# Patient Record
Sex: Female | Born: 1955 | Race: White | Hispanic: No | Marital: Married | Smoking: Never smoker
Health system: Southern US, Community
[De-identification: ages and names within clinical notes are randomized; demographics above are authoritative.]

## PROBLEM LIST (undated history)

## (undated) DIAGNOSIS — I10 Essential (primary) hypertension: Secondary | ICD-10-CM

## (undated) DIAGNOSIS — K219 Gastro-esophageal reflux disease without esophagitis: Secondary | ICD-10-CM

## (undated) DIAGNOSIS — E785 Hyperlipidemia, unspecified: Secondary | ICD-10-CM

## (undated) DIAGNOSIS — E119 Type 2 diabetes mellitus without complications: Secondary | ICD-10-CM

## (undated) DIAGNOSIS — T7840XA Allergy, unspecified, initial encounter: Secondary | ICD-10-CM

## (undated) DIAGNOSIS — F419 Anxiety disorder, unspecified: Secondary | ICD-10-CM

## (undated) HISTORY — DX: Allergy, unspecified, initial encounter: T78.40XA

## (undated) HISTORY — DX: Type 2 diabetes mellitus without complications: E11.9

## (undated) HISTORY — DX: Gastro-esophageal reflux disease without esophagitis: K21.9

## (undated) HISTORY — DX: Essential (primary) hypertension: I10

## (undated) HISTORY — PX: OTHER SURGICAL HISTORY: SHX169

## (undated) HISTORY — DX: Hyperlipidemia, unspecified: E78.5

## (undated) HISTORY — DX: Anxiety disorder, unspecified: F41.9

---

## 1988-07-03 HISTORY — PX: BREAST EXCISIONAL BIOPSY: SUR124

## 2000-02-24 ENCOUNTER — Other Ambulatory Visit: Admission: RE | Admit: 2000-02-24 | Discharge: 2000-02-24 | Payer: Self-pay | Admitting: Obstetrics & Gynecology

## 2001-07-03 DIAGNOSIS — C801 Malignant (primary) neoplasm, unspecified: Secondary | ICD-10-CM

## 2001-07-03 HISTORY — DX: Malignant (primary) neoplasm, unspecified: C80.1

## 2001-08-01 ENCOUNTER — Other Ambulatory Visit: Admission: RE | Admit: 2001-08-01 | Discharge: 2001-08-01 | Payer: Self-pay | Admitting: Obstetrics & Gynecology

## 2002-07-22 ENCOUNTER — Other Ambulatory Visit: Admission: RE | Admit: 2002-07-22 | Discharge: 2002-07-22 | Payer: Self-pay | Admitting: Obstetrics & Gynecology

## 2005-03-09 ENCOUNTER — Other Ambulatory Visit: Admission: RE | Admit: 2005-03-09 | Discharge: 2005-03-09 | Payer: Self-pay | Admitting: Obstetrics & Gynecology

## 2005-03-23 ENCOUNTER — Encounter: Admission: RE | Admit: 2005-03-23 | Discharge: 2005-03-23 | Payer: Self-pay | Admitting: Obstetrics & Gynecology

## 2006-02-27 ENCOUNTER — Encounter: Admission: RE | Admit: 2006-02-27 | Discharge: 2006-02-27 | Payer: Self-pay | Admitting: Obstetrics & Gynecology

## 2007-04-01 ENCOUNTER — Encounter: Admission: RE | Admit: 2007-04-01 | Discharge: 2007-04-01 | Payer: Self-pay | Admitting: Obstetrics & Gynecology

## 2008-04-15 ENCOUNTER — Encounter: Admission: RE | Admit: 2008-04-15 | Discharge: 2008-04-15 | Payer: Self-pay | Admitting: Obstetrics & Gynecology

## 2009-04-16 ENCOUNTER — Encounter: Admission: RE | Admit: 2009-04-16 | Discharge: 2009-04-16 | Payer: Self-pay | Admitting: Obstetrics & Gynecology

## 2009-04-26 ENCOUNTER — Encounter: Admission: RE | Admit: 2009-04-26 | Discharge: 2009-04-26 | Payer: Self-pay | Admitting: Obstetrics & Gynecology

## 2009-10-11 ENCOUNTER — Encounter: Admission: RE | Admit: 2009-10-11 | Discharge: 2009-10-11 | Payer: Self-pay | Admitting: Obstetrics & Gynecology

## 2010-04-19 ENCOUNTER — Encounter: Admission: RE | Admit: 2010-04-19 | Discharge: 2010-04-19 | Payer: Self-pay | Admitting: Obstetrics and Gynecology

## 2010-07-24 ENCOUNTER — Encounter: Payer: Self-pay | Admitting: Obstetrics & Gynecology

## 2011-03-13 ENCOUNTER — Other Ambulatory Visit: Payer: Self-pay | Admitting: Obstetrics and Gynecology

## 2011-03-13 DIAGNOSIS — Z1231 Encounter for screening mammogram for malignant neoplasm of breast: Secondary | ICD-10-CM

## 2011-04-21 ENCOUNTER — Ambulatory Visit
Admission: RE | Admit: 2011-04-21 | Discharge: 2011-04-21 | Disposition: A | Discharge status: Home / Self Care | Payer: BC Managed Care – PPO | Source: Ambulatory Visit | Attending: Obstetrics and Gynecology | Admitting: Obstetrics and Gynecology

## 2011-04-21 DIAGNOSIS — Z1231 Encounter for screening mammogram for malignant neoplasm of breast: Secondary | ICD-10-CM

## 2012-03-19 ENCOUNTER — Other Ambulatory Visit: Payer: Self-pay | Admitting: Obstetrics and Gynecology

## 2012-03-19 DIAGNOSIS — Z1231 Encounter for screening mammogram for malignant neoplasm of breast: Secondary | ICD-10-CM

## 2012-04-22 ENCOUNTER — Ambulatory Visit
Admission: RE | Admit: 2012-04-22 | Discharge: 2012-04-22 | Disposition: A | Discharge status: Home / Self Care | Payer: BC Managed Care – PPO | Source: Ambulatory Visit | Attending: Obstetrics and Gynecology | Admitting: Obstetrics and Gynecology

## 2012-04-22 DIAGNOSIS — Z1231 Encounter for screening mammogram for malignant neoplasm of breast: Secondary | ICD-10-CM

## 2013-03-18 ENCOUNTER — Other Ambulatory Visit: Payer: Self-pay

## 2013-03-18 DIAGNOSIS — Z1231 Encounter for screening mammogram for malignant neoplasm of breast: Secondary | ICD-10-CM

## 2013-04-23 ENCOUNTER — Ambulatory Visit
Admission: RE | Admit: 2013-04-23 | Discharge: 2013-04-23 | Disposition: A | Discharge status: Home / Self Care | Payer: BC Managed Care – PPO | Source: Ambulatory Visit

## 2013-04-23 DIAGNOSIS — Z1231 Encounter for screening mammogram for malignant neoplasm of breast: Secondary | ICD-10-CM

## 2014-03-23 ENCOUNTER — Other Ambulatory Visit: Payer: Self-pay

## 2014-03-23 DIAGNOSIS — Z1231 Encounter for screening mammogram for malignant neoplasm of breast: Secondary | ICD-10-CM

## 2014-04-27 ENCOUNTER — Ambulatory Visit
Admission: RE | Admit: 2014-04-27 | Discharge: 2014-04-27 | Disposition: A | Discharge status: Home / Self Care | Payer: BC Managed Care – PPO | Source: Ambulatory Visit

## 2014-04-27 DIAGNOSIS — Z1231 Encounter for screening mammogram for malignant neoplasm of breast: Secondary | ICD-10-CM

## 2015-03-26 ENCOUNTER — Other Ambulatory Visit: Payer: Self-pay

## 2015-03-26 DIAGNOSIS — Z1231 Encounter for screening mammogram for malignant neoplasm of breast: Secondary | ICD-10-CM

## 2015-04-29 ENCOUNTER — Ambulatory Visit: Payer: Self-pay

## 2015-05-19 ENCOUNTER — Ambulatory Visit
Admission: RE | Admit: 2015-05-19 | Discharge: 2015-05-19 | Disposition: A | Discharge status: Home / Self Care | Payer: 59 | Source: Ambulatory Visit

## 2015-05-19 DIAGNOSIS — Z1231 Encounter for screening mammogram for malignant neoplasm of breast: Secondary | ICD-10-CM

## 2016-04-18 ENCOUNTER — Other Ambulatory Visit: Payer: Self-pay | Admitting: Internal Medicine

## 2016-04-18 DIAGNOSIS — Z1231 Encounter for screening mammogram for malignant neoplasm of breast: Secondary | ICD-10-CM

## 2016-05-23 ENCOUNTER — Ambulatory Visit
Admission: RE | Admit: 2016-05-23 | Discharge: 2016-05-23 | Disposition: A | Discharge status: Home / Self Care | Payer: BLUE CROSS/BLUE SHIELD | Source: Ambulatory Visit | Attending: Internal Medicine | Admitting: Internal Medicine

## 2016-05-23 DIAGNOSIS — Z1231 Encounter for screening mammogram for malignant neoplasm of breast: Secondary | ICD-10-CM

## 2016-07-03 HISTORY — PX: COLONOSCOPY: SHX174

## 2017-04-26 ENCOUNTER — Other Ambulatory Visit: Payer: Self-pay | Admitting: Internal Medicine

## 2017-04-26 DIAGNOSIS — Z1231 Encounter for screening mammogram for malignant neoplasm of breast: Secondary | ICD-10-CM

## 2017-05-29 ENCOUNTER — Ambulatory Visit: Payer: BLUE CROSS/BLUE SHIELD

## 2017-06-11 ENCOUNTER — Ambulatory Visit: Payer: BLUE CROSS/BLUE SHIELD

## 2017-07-03 DIAGNOSIS — I495 Sick sinus syndrome: Secondary | ICD-10-CM

## 2017-07-03 HISTORY — DX: Sick sinus syndrome: I49.5

## 2017-07-10 ENCOUNTER — Ambulatory Visit
Admission: RE | Admit: 2017-07-10 | Discharge: 2017-07-10 | Disposition: A | Discharge status: Home / Self Care | Payer: BLUE CROSS/BLUE SHIELD | Source: Ambulatory Visit | Attending: Internal Medicine | Admitting: Internal Medicine

## 2017-07-10 DIAGNOSIS — Z1231 Encounter for screening mammogram for malignant neoplasm of breast: Secondary | ICD-10-CM

## 2017-10-31 HISTORY — PX: PACEMAKER IMPLANT: EP1218

## 2018-06-03 ENCOUNTER — Other Ambulatory Visit: Payer: Self-pay | Admitting: Internal Medicine

## 2018-06-03 DIAGNOSIS — Z1231 Encounter for screening mammogram for malignant neoplasm of breast: Secondary | ICD-10-CM

## 2018-07-02 ENCOUNTER — Telehealth: Payer: Self-pay | Admitting: Gastroenterology

## 2018-07-02 NOTE — Telephone Encounter (Signed)
Please see previous message and advise with repeat colonoscopy date;

## 2018-07-02 NOTE — Telephone Encounter (Signed)
Pt called inquiring about her colon recall date, she stated that her last colonoscopy was with Dr. Lyndel Safe in [place]. I was not able to find last colon report. She wants to know when she is due. Pls call her.

## 2018-07-04 NOTE — Telephone Encounter (Signed)
Pt called back to ask about recall colonoscopy date.

## 2018-07-09 NOTE — Telephone Encounter (Signed)
This report is on your desk.

## 2018-07-09 NOTE — Telephone Encounter (Signed)
Had colonoscopy 12/2016-5 colonic polyps status post polypectomy, biopsies tubular adenomas.  Plan; recall colonoscopy in 3YEARS. 12/2019. Pl inform the pt

## 2018-07-09 NOTE — Telephone Encounter (Signed)
Meghan Nguyen Can you find out when is the recall from our old system and infrom. Also, add to the new recall log. If any problems, pl print out the last colon report/biopsies and let me know. Thanks RG

## 2018-07-09 NOTE — Telephone Encounter (Signed)
Patient has been notified, recall put in [place].

## 2018-07-12 ENCOUNTER — Inpatient Hospital Stay: Admission: RE | Admit: 2018-07-12 | Payer: BLUE CROSS/BLUE SHIELD | Source: Ambulatory Visit

## 2019-06-23 ENCOUNTER — Telehealth: Payer: Self-pay

## 2019-06-23 NOTE — Telephone Encounter (Signed)
NOTES ON FILE FROM [facility] SENT REFERRAL TO [facility]

## 2019-06-30 ENCOUNTER — Telehealth: Payer: Self-pay

## 2019-06-30 NOTE — Telephone Encounter (Signed)
Medical records received from [facility]. Records routed to chart prep. 06/30/19 vlm

## 2019-07-01 ENCOUNTER — Telehealth: Payer: Self-pay

## 2019-07-01 NOTE — Telephone Encounter (Signed)
SENT RECORDS BY MAIL ON 07/01/19.TOO MANY PAGES TO FAX(122 PAGES)

## 2019-07-18 ENCOUNTER — Other Ambulatory Visit: Payer: Self-pay | Admitting: Internal Medicine

## 2019-07-18 DIAGNOSIS — Z780 Asymptomatic menopausal state: Secondary | ICD-10-CM

## 2019-07-18 DIAGNOSIS — Z1231 Encounter for screening mammogram for malignant neoplasm of breast: Secondary | ICD-10-CM

## 2019-08-12 ENCOUNTER — Ambulatory Visit: Payer: 59 | Admitting: Cardiovascular Disease

## 2019-08-12 ENCOUNTER — Encounter: Payer: Self-pay | Admitting: Cardiovascular Disease

## 2019-08-12 ENCOUNTER — Other Ambulatory Visit: Payer: Self-pay

## 2019-08-12 VITALS — BP 112/72 | HR 68 | Ht 62.0 in | Wt 169.0 lb

## 2019-08-12 DIAGNOSIS — E669 Obesity, unspecified: Secondary | ICD-10-CM

## 2019-08-12 DIAGNOSIS — Z950 Presence of cardiac pacemaker: Secondary | ICD-10-CM | POA: Diagnosis not present

## 2019-08-12 DIAGNOSIS — I10 Essential (primary) hypertension: Secondary | ICD-10-CM

## 2019-08-12 DIAGNOSIS — I495 Sick sinus syndrome: Secondary | ICD-10-CM | POA: Diagnosis not present

## 2019-08-12 DIAGNOSIS — E7800 Pure hypercholesterolemia, unspecified: Secondary | ICD-10-CM | POA: Diagnosis not present

## 2019-08-12 DIAGNOSIS — E1169 Type 2 diabetes mellitus with other specified complication: Secondary | ICD-10-CM | POA: Diagnosis not present

## 2019-08-12 NOTE — Patient Instructions (Addendum)

## 2019-08-12 NOTE — Progress Notes (Signed)
Cardiology Office Note:    Date:  08/14/2019   ID:  Meghan Nguyen, DOB Jan 03, 1956, MRN [id number]  PCP:  Meghan Johns, MD  Cardiologist:  No primary care provider on file.  Electrophysiologist:  None previously Dr. Minna Nguyen  Previous cardiologist: Meghan Nguyen., MD , [facility]  Chief Complaint  Patient presents with  . Pacemaker Check  Meghan Nguyen is to establish new cardiology care in our practice due to insurance purposes.  History of Present Illness:    Meghan Nguyen is a 64 y.o. female with a hx of severe sinus node dysfunction leading to syncope, sinus pauses of greater than 5 seconds documented on event monitor in 2019.  She received a dual-chamber Biotronik pacemaker in May 2019 and has not had problems with dizziness, syncope or fatigue since then.  Additional medical problems include hypertension and hypercholesterolemia and type II diabetes mellitus.  Cardiac catheterization in 2019 did not show any evidence of coronary artery disease (false positive nuclear stress test) and LV angiography documented normal left ventricular ejection fraction.  She is not sure if she ever had an echocardiogram and I was unable to locate any records of an echo through [facility].  Her last pacemaker download was in October 2020 and showed normal findings.  I performed a comprehensive pacemaker check in the office today including lead capture threshold testing.  The device is functioning normally.  The estimated generator longevity is 8 years.  Lead parameters are excellent.  She does not require ventricular pacing and has roughly 52% atrial pacing with appropriate heart rate histograms (CLS on).  There have been no episodes of atrial fibrillation or atrial mode switch, but interestingly she did have a 20 beat episode of nonsustained ventricular tachycardia on May 25, 2019, after her last pacemaker download.  Her ECG shows atrial paced, ventricular sensed rhythm with prolonged AV delay at  230 ms, but is otherwise completely normal.  The QTC is 448 ms.  The patient specifically denies any chest pain at rest exertion, dyspnea at rest or with exertion, orthopnea, paroxysmal nocturnal dyspnea, syncope, palpitations, focal neurological deficits, intermittent claudication, lower extremity edema, unexplained weight gain, cough, hemoptysis or wheezing.  Glycemic control is excellent with a recent hemoglobin A1c of 5.8%.  Likewise her lipid profile is good with total cholesterol 137, LDL 50 HDL 45, triglycerides 167.  She has normal renal function.  Past Medical History:  Diagnosis Date  . SSS (sick sinus syndrome) ([place]) 2019    Past Surgical History:  Procedure Laterality Date  . BREAST EXCISIONAL BIOPSY Left 1990   benign  . PACEMAKER IMPLANT Left 10/2017   Biotronik    Current Medications: Current Meds  Medication Sig  . amLODipine (NORVASC) 5 MG tablet Take 5 mg by mouth daily.  Marland Kitchen atorvastatin (LIPITOR) 40 MG tablet Take 40 mg by mouth daily.  Marland Kitchen Black Elderberry (SAMBUCUS ELDERBERRY PO) Take by mouth daily.  . Cholecalciferol (VITAMIN D3) 50 MCG (2000 UT) TABS Take 2 tablets by mouth daily.  . citalopram (CELEXA) 20 MG tablet Take 20 mg by mouth daily.  Marland Kitchen loratadine (CLARITIN) 10 MG tablet Take 10 mg by mouth daily.  . Melatonin 10 MG TABS Take by mouth at bedtime.  . metoprolol tartrate (LOPRESSOR) 25 MG tablet Take 25 mg by mouth 2 (two) times daily.  Marland Kitchen MYRBETRIQ 25 MG TB24 tablet Take 25 mg by mouth daily.  . nitroGLYCERIN (NITROSTAT) 0.4 MG SL tablet Place under the tongue.  . Omega-3 1000  MG CAPS Take by mouth daily.  Marland Kitchen omeprazole (PRILOSEC) 40 MG capsule Take 40 mg by mouth daily.  . TRULICITY A999333 0000000 SOPN SMARTSIG:1 Pre-Filled Pen Syringe SUB-Q Once a Week  . Turmeric 500 MG CAPS Take by mouth daily.     Allergies:   Patient has no known allergies.   Social History   Socioeconomic History  . Marital status: Married    Spouse name: Not on file  .  Number of children: Not on file  . Years of education: Not on file  . Highest education level: Not on file  Occupational History  . Not on file  Tobacco Use  . Smoking status: Never Smoker  . Smokeless tobacco: Never Used  Substance and Sexual Activity  . Alcohol use: Not on file  . Drug use: Not on file  . Sexual activity: Not on file  Other Topics Concern  . Not on file  Social History Narrative  . Not on file   Social Determinants of Health   Financial Resource Strain:   . Difficulty of Paying Living Expenses: Not on file  Food Insecurity:   . Worried About Charity fundraiser in the Last Year: Not on file  . Ran Out of Food in the Last Year: Not on file  Transportation Needs:   . Lack of Transportation (Medical): Not on file  . Lack of Transportation (Non-Medical): Not on file  Physical Activity:   . Days of Exercise per Week: Not on file  . Minutes of Exercise per Session: Not on file  Stress:   . Feeling of Stress : Not on file  Social Connections:   . Frequency of Communication with Friends and Family: Not on file  . Frequency of Social Gatherings with Friends and Family: Not on file  . Attends Religious Services: Not on file  . Active Member of Clubs or Organizations: Not on file  . Attends Archivist Meetings: Not on file  . Marital Status: Not on file     Family History: The patient's family history significantly negative for early onset coronary or peripheral vascular disease or rhythm abnormalities. ROS:   Please see the history of present illness.     All other systems reviewed and are negative.  EKGs/Labs/Other Studies Reviewed:    The following studies were reviewed today: Extensive notes from [facility] including several pacemaker downloads, cardiac catheterization and nuclear stress test findings and labs.  EKG:  EKG is  ordered today.  The ekg ordered today demonstrates atrial paced, ventricular sensed rhythm with long AV  conduction time at 230 ms, otherwise normal tracing.  QTc 448 ms  Recent Labs: No results found for requested labs within last 8760 hours.  04/15/2019 creatinine 0.71, potassium 4.2, normal liver function tests 07/15/2019 Creatinine 0.77, potassium 4.3, normal liver function tests, TSH 1.15, hemoglobin 13.8, hemoglobin A1c 5.8%. Recent Lipid Panel No results found for: CHOL, TRIG, HDL, CHOLHDL, VLDL, LDLCALC, LDLDIRECT  04/15/2019 Total cholesterol 121, triglycerides are 123, HDL 36, LDL 50 07/15/2019 Total cholesterol 137, HDL 45, triglyceride 167, LDL 50  Physical Exam:    VS:  BP 112/72   Pulse 68   Ht 5\' 2"  (1.575 m)   Wt 169 lb (76.7 kg)   BMI 30.91 kg/m     Wt Readings from Last 3 Encounters:  08/12/19 169 lb (76.7 kg)     GEN: Borderline obese, well nourished, well developed in no acute distress HEENT: Normal NECK: No JVD;  No carotid bruits LYMPHATICS: No lymphadenopathy CARDIAC: Healthy left subclavian pacemaker site, RRR, no murmurs, rubs, gallops RESPIRATORY:  Clear to auscultation without rales, wheezing or rhonchi  ABDOMEN: Soft, non-tender, non-distended MUSCULOSKELETAL:  No edema; No deformity  SKIN: Warm and dry NEUROLOGIC:  Alert and oriented x 3 PSYCHIATRIC:  Normal affect   ASSESSMENT:    1. SSS (sick sinus syndrome) ([place])   2. Pacemaker   3. Hypercholesterolemia   4. Diabetes mellitus type 2 in obese ([place])   5. Essential hypertension    PLAN:    In order of problems listed above:  1. SSS: History of syncope and severe sinus bradycardia as well as impressively long sinus pauses over 5 seconds.  Asymptomatic since pacemaker implantation.  Excellent heart rate histogram distribution with current device sensor settings. 2. Pacemaker: Normal device function.  Will enroll in our device clinic and perform routine downloads remotely every 3 months, yearly office visit. 3. HLP: Excellent lipid profile on current medical regimen with atorvastatin. 4. DM:  Outstanding glycemic control on Trulicity 5. HTN: Well-controlled may benefit from ARB rather than calcium channel blocker for renal protection, but will defer this to her primary care provider/endocrinologist.  She has excellent renal function at this time.   Medication Adjustments/Labs and Tests Ordered: Current medicines are reviewed at length with the patient today.  Concerns regarding medicines are outlined above.  Orders Placed This Encounter  Procedures  . EKG 12-Lead   No orders of the defined types were placed in this encounter.   Patient Instructions  Medication Instructions:  No changes *If you need a refill on your cardiac medications before your next appointment, please call your pharmacy*  Lab Work: None ordered If you have labs (blood work) drawn today and your tests are completely normal, you will receive your results only by: Marland Kitchen MyChart Message (if you have MyChart) OR . A paper copy in the mail If you have any lab test that is abnormal or we need to change your treatment, we will call you to review the results.  Testing/Procedures: None ordered  Follow-Up: At [facility], you and your health needs are our priority.  As part of our continuing mission to provide you with exceptional heart care, we have created designated Provider Care Teams.  These Care Teams include your primary Cardiologist (physician) and Advanced Practice Providers (APPs -  Physician Assistants and Nurse Practitioners) who all work together to provide you with the care you need, when you need it.  Your next appointment:   12 month(s)  The format for your next appointment:   In Person  Provider:   Sanda Klein, MD      Signed, Sanda Klein, MD  08/14/2019 7:13 PM    [place]

## 2019-08-14 ENCOUNTER — Encounter: Payer: Self-pay | Admitting: Cardiovascular Disease

## 2019-08-14 DIAGNOSIS — E7800 Pure hypercholesterolemia, unspecified: Secondary | ICD-10-CM | POA: Insufficient documentation

## 2019-08-14 DIAGNOSIS — Z950 Presence of cardiac pacemaker: Secondary | ICD-10-CM | POA: Insufficient documentation

## 2019-08-14 DIAGNOSIS — I10 Essential (primary) hypertension: Secondary | ICD-10-CM | POA: Insufficient documentation

## 2019-08-14 DIAGNOSIS — E119 Type 2 diabetes mellitus without complications: Secondary | ICD-10-CM | POA: Insufficient documentation

## 2019-08-14 DIAGNOSIS — E1169 Type 2 diabetes mellitus with other specified complication: Secondary | ICD-10-CM | POA: Insufficient documentation

## 2019-08-14 DIAGNOSIS — I495 Sick sinus syndrome: Secondary | ICD-10-CM | POA: Insufficient documentation

## 2019-10-03 ENCOUNTER — Ambulatory Visit: Payer: BLUE CROSS/BLUE SHIELD

## 2019-10-03 ENCOUNTER — Other Ambulatory Visit: Payer: BLUE CROSS/BLUE SHIELD

## 2019-10-03 ENCOUNTER — Other Ambulatory Visit: Payer: Self-pay

## 2019-10-03 ENCOUNTER — Ambulatory Visit
Admission: RE | Admit: 2019-10-03 | Discharge: 2019-10-03 | Disposition: A | Discharge status: Home / Self Care | Payer: 59 | Source: Ambulatory Visit | Attending: Internal Medicine | Admitting: Internal Medicine

## 2019-10-03 DIAGNOSIS — Z1231 Encounter for screening mammogram for malignant neoplasm of breast: Secondary | ICD-10-CM

## 2019-10-03 DIAGNOSIS — Z780 Asymptomatic menopausal state: Secondary | ICD-10-CM

## 2019-10-21 ENCOUNTER — Encounter: Payer: Self-pay | Admitting: Gastroenterology

## 2019-11-20 ENCOUNTER — Other Ambulatory Visit: Payer: Self-pay

## 2019-11-20 ENCOUNTER — Ambulatory Visit (AMBULATORY_SURGERY_CENTER): Payer: Self-pay

## 2019-11-20 VITALS — Ht 62.0 in | Wt 166.8 lb

## 2019-11-20 DIAGNOSIS — Z01818 Encounter for other preprocedural examination: Secondary | ICD-10-CM

## 2019-11-20 DIAGNOSIS — Z86010 Personal history of colonic polyps: Secondary | ICD-10-CM

## 2019-11-20 NOTE — Progress Notes (Signed)
No allergies to soy or egg Pt is not on blood thinners or diet pills Denies issues with sedation/intubation Denies atrial flutter/fib Denies constipation   Emmi instructions given to pt  Pt is aware of Covid safety and care partner requirements.  

## 2019-11-28 ENCOUNTER — Other Ambulatory Visit: Payer: Self-pay | Admitting: Gastroenterology

## 2019-11-28 ENCOUNTER — Ambulatory Visit (INDEPENDENT_AMBULATORY_CARE_PROVIDER_SITE_OTHER): Payer: 59

## 2019-11-28 DIAGNOSIS — Z1159 Encounter for screening for other viral diseases: Secondary | ICD-10-CM

## 2019-12-02 LAB — SARS CORONAVIRUS 2 (TAT 6-24 HRS): SARS Coronavirus 2: NEGATIVE

## 2019-12-04 ENCOUNTER — Other Ambulatory Visit: Payer: Self-pay

## 2019-12-04 ENCOUNTER — Ambulatory Visit (AMBULATORY_SURGERY_CENTER): Payer: 59 | Admitting: Gastroenterology

## 2019-12-04 ENCOUNTER — Encounter: Payer: Self-pay | Admitting: Gastroenterology

## 2019-12-04 VITALS — BP 130/74 | HR 66 | Temp 96.8°F | Resp 12 | Ht 62.0 in | Wt 166.8 lb

## 2019-12-04 DIAGNOSIS — D125 Benign neoplasm of sigmoid colon: Secondary | ICD-10-CM

## 2019-12-04 DIAGNOSIS — D123 Benign neoplasm of transverse colon: Secondary | ICD-10-CM

## 2019-12-04 DIAGNOSIS — Z86010 Personal history of colonic polyps: Secondary | ICD-10-CM

## 2019-12-04 MED ORDER — SODIUM CHLORIDE 0.9 % IV SOLN: 500.0000 mL | Freq: Once | INTRAVENOUS | Status: DC

## 2019-12-04 NOTE — Patient Instructions (Signed)
Thank you for allowing [place] to care for you today!  Await pathology results, approximately 7-10 days.  Will make recommendation at that time for next colonoscopy.  Resume previous diet and medications today.  Recommend adopting a high fiber diet.  Return to your normal activities tomorrow.  YOU HAD AN ENDOSCOPIC PROCEDURE TODAY AT [facility]:   Refer to the procedure report that was given to you for any specific questions about what was found during the examination.  If the procedure report does not answer your questions, please call your gastroenterologist to clarify.  If you requested that your care partner not be given the details of your procedure findings, then the procedure report has been included in a sealed envelope for you to review at your convenience later.  YOU SHOULD EXPECT: Some feelings of bloating in the abdomen. Passage of more gas than usual.  Walking can help get rid of the air that was put into your GI tract during the procedure and reduce the bloating. If you had a lower endoscopy (such as a colonoscopy or flexible sigmoidoscopy) you may notice spotting of blood in your stool or on the toilet paper. If you underwent a bowel prep for your procedure, you may not have a normal bowel movement for a few days.  Please Note:  You might notice some irritation and congestion in your nose or some drainage.  This is from the oxygen used during your procedure.  There is no need for concern and it should clear up in a day or so.  SYMPTOMS TO REPORT IMMEDIATELY:   Following lower endoscopy (colonoscopy or flexible sigmoidoscopy):  Excessive amounts of blood in the stool  Significant tenderness or worsening of abdominal pains  Swelling of the abdomen that is new, acute  Fever of 100F or higher   For urgent or emergent issues, a gastroenterologist can be reached at any hour by calling [phone]. Do not use MyChart messaging for urgent concerns.    DIET:  We do  recommend a small meal at first, but then you may proceed to your regular diet.  Drink plenty of fluids but you should avoid alcoholic beverages for 24 hours.  ACTIVITY:  You should plan to take it easy for the rest of today and you should NOT DRIVE or use heavy machinery until tomorrow (because of the sedation medicines used during the test).    FOLLOW UP: Our staff will call the number listed on your records 48-72 hours following your procedure to check on you and address any questions or concerns that you may have regarding the information given to you following your procedure. If we do not reach you, we will leave a message.  We will attempt to reach you two times.  During this call, we will ask if you have developed any symptoms of COVID 19. If you develop any symptoms (ie: fever, flu-like symptoms, shortness of breath, cough etc.) before then, please call [phone].  If you test positive for Covid 19 in the 2 weeks post procedure, please call and report this information to [place].    If any biopsies were taken you will be contacted by phone or by letter within the next 1-3 weeks.  Please call us at [phone] if you have not heard about the biopsies in 3 weeks.    SIGNATURES/CONFIDENTIALITY: You and/or your care partner have signed paperwork which will be entered into your electronic medical record.  These signatures attest to the fact that  that the information above on your After Visit Summary has been reviewed and is understood.  Full responsibility of the confidentiality of this discharge information lies with you and/or your care-partner.

## 2019-12-04 NOTE — Progress Notes (Signed)
Called to room to assist during endoscopic procedure.  Patient ID and intended procedure confirmed with present staff. Received instructions for my participation in the procedure from the performing physician.  

## 2019-12-04 NOTE — Progress Notes (Signed)
Report given to PACU, vss 

## 2019-12-04 NOTE — Op Note (Signed)
[facility] Patient Name: Meghan Nguyen Procedure Date: 12/04/2019 7:54 AM MRN: [id number] Endoscopist: Jackquline Denmark , MD Age: 64 Referring MD:  Date of Birth: 04-26-1956 Gender: Female Account #: [id number] Procedure:                Colonoscopy Indications:              High risk colon cancer surveillance: Personal                            history of colonic polyps. FH polyps (mom) Medicines:                Monitored Anesthesia Care Procedure:                Pre-Anesthesia Assessment:                           - Prior to the procedure, a History and Physical                            was performed, and patient medications and                            allergies were reviewed. The patient's tolerance of                            previous anesthesia was also reviewed. The risks                            and benefits of the procedure and the sedation                            options and risks were discussed with the patient.                            All questions were answered, and informed consent                            was obtained. Prior Anticoagulants: The patient has                            taken no previous anticoagulant or antiplatelet                            agents. ASA Grade Assessment: II - A patient with                            mild systemic disease. After reviewing the risks                            and benefits, the patient was deemed in                            satisfactory condition to undergo the procedure.  After obtaining informed consent, the colonoscope                            was passed under direct vision. Throughout the                            procedure, the patient's blood pressure, pulse, and                            oxygen saturations were monitored continuously. The                            Colonoscope was introduced through the anus and                            advanced to the 2 cm into  the ileum. The                            colonoscopy was performed without difficulty. The                            patient tolerated the procedure well. The quality                            of the bowel preparation was good. The terminal                            ileum, ileocecal valve, appendiceal orifice, and                            rectum were photographed. Scope In: 8:04:50 AM Scope Out: 8:22:44 AM Total Procedure Duration: 0 hours 17 minutes 54 seconds  Findings:                 Three sessile polyps were found in the mid sigmoid                            colon, distal sigmoid colon and proximal transverse                            colon. The polyps were 4 to 6 mm in size. These                            polyps were removed with a cold snare. Resection                            and retrieval were complete.                           A few small-mouthed diverticula were found in the                            sigmoid colon and ascending colon.  Non-bleeding internal hemorrhoids were found during                            retroflexion. The hemorrhoids were moderate.                           The terminal ileum appeared normal.                           The exam was otherwise without abnormality on                            direct and retroflexion views. Complications:            No immediate complications. Estimated Blood Loss:     Estimated blood loss: none. Impression:               - Three 4 to 6 mm polyps in the mid sigmoid colon,                            in the distal sigmoid colon and in the proximal                            transverse colon, removed with a cold snare.                            Resected and retrieved.                           - Mild pancolonic diverticulosis.                           - Otherwise normal colonoscopy to TI. Recommendation:           - Patient has a contact number available for                             emergencies. The signs and symptoms of potential                            delayed complications were discussed with the                            patient. Return to normal activities tomorrow.                            Written discharge instructions were provided to the                            patient.                           - High fiber diet.                           - Continue present medications.                           -  Await pathology results.                           - Repeat colonoscopy for surveillance based on                            pathology results.                           - Return to GI office PRN.                           - The findings and recommendations were discussed                            with Timothy. Jackquline Denmark, MD 12/04/2019 8:30:50 AM This report has been signed electronically.

## 2019-12-04 NOTE — Progress Notes (Signed)
Temp JB VS DT  Pt's states no medical or surgical changes since previsit or office visit. 

## 2019-12-08 ENCOUNTER — Telehealth: Payer: Self-pay

## 2019-12-08 ENCOUNTER — Telehealth: Payer: Self-pay | Admitting: *Deleted

## 2019-12-08 NOTE — Telephone Encounter (Signed)
First attempt, left VM.  

## 2019-12-08 NOTE — Telephone Encounter (Signed)
  Follow up Call-  Call back number 12/04/2019  Post procedure Call Back phone  # [phone]  Permission to leave phone message Yes  Some recent data might be hidden     Patient questions:  Do you have a fever, pain , or abdominal swelling? No. Pain Score  0 *  Have you tolerated food without any problems? Yes.    Have you been able to return to your normal activities? Yes.    Do you have any questions about your discharge instructions: Diet   No. Medications  No. Follow up visit  No.  Do you have questions or concerns about your Care? No.  Actions: * If pain score is 4 or above: No action needed, pain <4.  1. Have you developed a fever since your procedure? no  2.   Have you had an respiratory symptoms (SOB or cough) since your procedure? no  3.   Have you tested positive for COVID 19 since your procedure no  4.   Have you had any family members/close contacts diagnosed with the COVID 19 since your procedure?  no   If yes to any of these questions please route to Joylene John, RN and Erenest Rasher, RN

## 2019-12-11 ENCOUNTER — Telehealth: Payer: Self-pay | Admitting: Gastroenterology

## 2019-12-11 NOTE — Telephone Encounter (Signed)
Requesting path results 

## 2019-12-12 NOTE — Telephone Encounter (Signed)
Spoke with patient about concern of precancerous polyps.  Explained to her these are polyps that could possibly turn into cancer.  She was concerned about waiting five years for a repeat.  I told her this was based on number, size, and type of polyp. She wants to make sure its okay to wait five years instead of three.

## 2019-12-12 NOTE — Telephone Encounter (Signed)
Left message on patients voicemail regarding path report.  Assured patient that small precancerous polyp is nothing to be worried about.  Also told her Dr Lyndel Safe wanted to know if her husband took her out for a good meal after colon or not? I told her she would be placed on recall for repeat colonoscopy in 5 years.

## 2019-12-12 NOTE — Telephone Encounter (Signed)
Pls call pt, she has some questions about her results, her phone is [phone].

## 2019-12-12 NOTE — Telephone Encounter (Signed)
Biopsies from colonoscopy-small precancerous polyps.  Nothing to be worried about Repeat colon in in 5 years.  Peter Congo, please let her know. Also ask if Christia Reading took her for good meal after colon or not? I did tell him to.  Carlis Stable, no letter. Needs to be in recall   RG

## 2019-12-17 NOTE — Telephone Encounter (Signed)
Lets put her on recall for 3 years (instead of 5 years) Please inform her and change it in the system  RG

## 2019-12-17 NOTE — Telephone Encounter (Signed)
Colonoscopy recall for 12/2022 placed in patients chart. Patient understands that she will get a reminder to have a repeat colonoscopy at that time.

## 2020-02-10 ENCOUNTER — Ambulatory Visit (INDEPENDENT_AMBULATORY_CARE_PROVIDER_SITE_OTHER): Payer: 59 | Admitting: *Deleted

## 2020-02-10 DIAGNOSIS — I495 Sick sinus syndrome: Secondary | ICD-10-CM | POA: Diagnosis not present

## 2020-02-11 LAB — CUP PACEART REMOTE DEVICE CHECK
Date Time Interrogation Session: 20210811072659
Implantable Lead Implant Date: 20190502
Implantable Lead Implant Date: 20190502
Implantable Lead Location: 753859
Implantable Lead Location: 753860
Implantable Lead Model: 377
Implantable Lead Model: 377
Implantable Lead Serial Number: 80690122
Implantable Lead Serial Number: 8069098
Implantable Pulse Generator Implant Date: 20190502
Pulse Gen Model: 407145
Pulse Gen Serial Number: 8069098

## 2020-02-12 NOTE — Progress Notes (Signed)
Remote pacemaker transmission.   

## 2020-04-19 ENCOUNTER — Other Ambulatory Visit: Payer: Self-pay

## 2020-04-19 MED ORDER — METOPROLOL TARTRATE 25 MG PO TABS
25.0000 mg | ORAL_TABLET | Freq: Two times a day (BID) | ORAL | 3 refills | Status: DC
Start: 2020-04-19 — End: 2021-08-29

## 2020-05-11 ENCOUNTER — Ambulatory Visit (INDEPENDENT_AMBULATORY_CARE_PROVIDER_SITE_OTHER): Payer: 59

## 2020-05-11 DIAGNOSIS — I495 Sick sinus syndrome: Secondary | ICD-10-CM | POA: Diagnosis not present

## 2020-05-12 LAB — CUP PACEART REMOTE DEVICE CHECK
Date Time Interrogation Session: 20211110071047
Implantable Lead Implant Date: 20190502
Implantable Lead Implant Date: 20190502
Implantable Lead Location: 753859
Implantable Lead Location: 753860
Implantable Lead Model: 377
Implantable Lead Model: 377
Implantable Lead Serial Number: 80690122
Implantable Lead Serial Number: 8069098
Implantable Pulse Generator Implant Date: 20190502
Pulse Gen Model: 407145
Pulse Gen Serial Number: 8069098

## 2020-05-13 NOTE — Progress Notes (Signed)
Remote pacemaker transmission.   

## 2020-08-10 ENCOUNTER — Encounter: Payer: 59 | Admitting: Cardiovascular Disease

## 2020-08-10 ENCOUNTER — Ambulatory Visit (INDEPENDENT_AMBULATORY_CARE_PROVIDER_SITE_OTHER): Payer: 59

## 2020-08-10 DIAGNOSIS — I495 Sick sinus syndrome: Secondary | ICD-10-CM | POA: Diagnosis not present

## 2020-08-10 LAB — CUP PACEART REMOTE DEVICE CHECK
Date Time Interrogation Session: 20220208112420
Implantable Lead Implant Date: 20190502
Implantable Lead Implant Date: 20190502
Implantable Lead Location: 753859
Implantable Lead Location: 753860
Implantable Lead Model: 377
Implantable Lead Model: 377
Implantable Lead Serial Number: 80690122
Implantable Lead Serial Number: 8069098
Implantable Pulse Generator Implant Date: 20190502
Pulse Gen Model: 407145
Pulse Gen Serial Number: 8069098

## 2020-08-16 ENCOUNTER — Other Ambulatory Visit: Payer: Self-pay | Admitting: Internal Medicine

## 2020-08-16 DIAGNOSIS — Z1231 Encounter for screening mammogram for malignant neoplasm of breast: Secondary | ICD-10-CM

## 2020-08-16 NOTE — Progress Notes (Signed)
Remote pacemaker transmission.   

## 2020-10-05 ENCOUNTER — Other Ambulatory Visit: Payer: Self-pay

## 2020-10-05 ENCOUNTER — Ambulatory Visit
Admission: RE | Admit: 2020-10-05 | Discharge: 2020-10-05 | Disposition: A | Discharge status: Home / Self Care | Payer: 59 | Source: Ambulatory Visit | Attending: Internal Medicine | Admitting: Internal Medicine

## 2020-10-05 DIAGNOSIS — Z1231 Encounter for screening mammogram for malignant neoplasm of breast: Secondary | ICD-10-CM

## 2020-11-03 ENCOUNTER — Ambulatory Visit (INDEPENDENT_AMBULATORY_CARE_PROVIDER_SITE_OTHER): Payer: 59 | Admitting: Cardiovascular Disease

## 2020-11-03 ENCOUNTER — Other Ambulatory Visit: Payer: Self-pay

## 2020-11-03 ENCOUNTER — Encounter: Payer: Self-pay | Admitting: Cardiovascular Disease

## 2020-11-03 VITALS — BP 146/84 | HR 68 | Ht 62.0 in | Wt 174.4 lb

## 2020-11-03 DIAGNOSIS — Z950 Presence of cardiac pacemaker: Secondary | ICD-10-CM

## 2020-11-03 DIAGNOSIS — E1169 Type 2 diabetes mellitus with other specified complication: Secondary | ICD-10-CM

## 2020-11-03 DIAGNOSIS — I10 Essential (primary) hypertension: Secondary | ICD-10-CM

## 2020-11-03 DIAGNOSIS — E7800 Pure hypercholesterolemia, unspecified: Secondary | ICD-10-CM

## 2020-11-03 DIAGNOSIS — I495 Sick sinus syndrome: Secondary | ICD-10-CM | POA: Diagnosis not present

## 2020-11-03 DIAGNOSIS — E669 Obesity, unspecified: Secondary | ICD-10-CM

## 2020-11-03 NOTE — Progress Notes (Signed)
Cardiology Office Note:    Date:  11/04/2020   ID:  Meghan Nguyen, DOB October 22, 1955, MRN [id number]  PCP:  Nicholos Johns, MD  Cardiologist:  Kenlyn Lose Electrophysiologist:  None   Chief Complaint  Patient presents with  . Pacemaker Check    History of Present Illness:    Meghan Nguyen is a 65 y.o. female with a hx of severe sinus node dysfunction leading to syncope, sinus pauses of greater than 5 seconds documented on event monitor in 2019.  She received a dual-chamber Biotronik pacemaker in May 2019 and has not had problems with dizziness, syncope or fatigue since then.  Additional medical problems include hypertension and hypercholesterolemia and type II diabetes mellitus.  Cardiac catheterization in 2019 did not show any evidence of coronary artery disease (false positive nuclear stress test) and LV angiography documented normal left ventricular ejection fraction.   She has not had any major cardiac or other types of health problems since we last met.  Her blood pressure is a little high today but she was late in taking her medications, just 30 minutes before this appointment.  The patient specifically denies any chest pain at rest exertion, dyspnea at rest or with exertion, orthopnea, paroxysmal nocturnal dyspnea, syncope, palpitations, focal neurological deficits, intermittent claudication, lower extremity edema, unexplained weight gain, cough, hemoptysis or wheezing.  Pacemaker interrogation in the office today shows normal device function.  Her dual-chamber Biotronik pacemaker has approximately 7 years of remaining longevity.  She has 60% atrial pacing and no ventricular pacing, not pacemaker dependent.  She had a 4-second episode of paroxysmal atrial tachycardia in December and no documented arrhythmia since.  She has a good heart rate histogram distribution.  The device documents an average of about 12% activity (roughly 3 hours a day).  Lead parameters, recheck today overall normal.   Presenting rhythm was atrial paced, ventricular sensed, underlying rhythm is mild sinus bradycardia in the mid 50s.  Metabolic parameters are good.  Her most recent hemoglobin A1c just a few weeks ago was 5.9%.  On the same day and her LDL cholesterol was 78.  Her most recent creatinine was normal at 0.75.   Past Medical History:  Diagnosis Date  . Allergy    allergies  . Anxiety   . Cancer ([place]) 2003   Melanoma   . Diabetes mellitus without complication ([place])   . GERD (gastroesophageal reflux disease)   . Hyperlipidemia   . Hypertension   . SSS (sick sinus syndrome) ([place]) 2019    Past Surgical History:  Procedure Laterality Date  . bladder tacked    . BREAST EXCISIONAL BIOPSY Left 1990   benign  . COLONOSCOPY  2018  . PACEMAKER IMPLANT Left 10/2017   Biotronik    Current Medications: Current Meds  Medication Sig  . amLODipine (NORVASC) 5 MG tablet Take 5 mg by mouth daily.  Marland Kitchen atorvastatin (LIPITOR) 40 MG tablet Take 40 mg by mouth daily.  Marland Kitchen Black Elderberry (SAMBUCUS ELDERBERRY PO) Take by mouth daily.  . Cholecalciferol (VITAMIN D3) 50 MCG (2000 UT) TABS Take 2 tablets by mouth daily.  . citalopram (CELEXA) 20 MG tablet Take 20 mg by mouth daily.  . clotrimazole (LOTRIMIN) 1 % cream   . loratadine (CLARITIN) 10 MG tablet Take 10 mg by mouth daily.  . meclizine (ANTIVERT) 12.5 MG tablet Take 12.5 mg by mouth 2 (two) times daily as needed.  . Melatonin 10 MG TABS Take by mouth at bedtime.  . metoprolol tartrate (  LOPRESSOR) 25 MG tablet Take 1 tablet (25 mg total) by mouth 2 (two) times daily.  . montelukast (SINGULAIR) 10 MG tablet Take 10 mg by mouth at bedtime.  Marland Kitchen MYRBETRIQ 25 MG TB24 tablet Take 25 mg by mouth daily.  . nitroGLYCERIN (NITROSTAT) 0.4 MG SL tablet Place under the tongue.  . Omega-3 1000 MG CAPS Take by mouth daily.  Marland Kitchen omeprazole (PRILOSEC) 40 MG capsule Take 40 mg by mouth daily.  . TRULICITY 0.34 VQ/2.5ZD SOPN SMARTSIG:1 Pre-Filled Pen Syringe SUB-Q  Once a Week  . Turmeric 500 MG CAPS Take by mouth daily.     Allergies:   Patient has no known allergies.   Social History   Socioeconomic History  . Marital status: Married    Spouse name: Not on file  . Number of children: Not on file  . Years of education: Not on file  . Highest education level: Not on file  Occupational History  . Not on file  Tobacco Use  . Smoking status: Never Smoker  . Smokeless tobacco: Never Used  Vaping Use  . Vaping Use: Never used  Substance and Sexual Activity  . Alcohol use: Not Currently  . Drug use: Never  . Sexual activity: Not on file  Other Topics Concern  . Not on file  Social History Narrative  . Not on file   Social Determinants of Health   Financial Resource Strain: Not on file  Food Insecurity: Not on file  Transportation Needs: Not on file  Physical Activity: Not on file  Stress: Not on file  Social Connections: Not on file     Family History: The patient's family history significantly negative for early onset coronary or peripheral vascular disease or rhythm abnormalities. ROS:   Please see the history of present illness.     All other systems reviewed and are negative.  EKGs/Labs/Other Studies Reviewed:    The following studies were reviewed today: Comprehensive pacemaker check in the office today.  EKG:  EKG is  ordered today.  It shows atrial paced, ventricular sensed rhythm with long AV delay at 234 ms, otherwise normal tracing, QTC 444 ms.  Recent Labs: No results found for requested labs within last 8760 hours.  04/15/2019 creatinine 0.71, potassium 4.2, normal liver function tests 07/15/2019 Creatinine 0.77, potassium 4.3, normal liver function tests, TSH 1.15, hemoglobin 13.8, hemoglobin A1c 5.8%. 09/23/2020 Creatinine 0.75, potassium 4.3, hemoglobin 15.2, normal liver function tests, TSH 1.1, hemoglobin A1c 5.9% Recent Lipid Panel No results found for: CHOL, TRIG, HDL, CHOLHDL, VLDL, LDLCALC, LDLDIRECT   04/15/2019 Total cholesterol 121, triglycerides are 123, HDL 36, LDL 50 07/15/2019 Total cholesterol 137, HDL 45, triglyceride 167, LDL 50 09/23/2020 Cholesterol 161, HDL 55, LDL 78, triglycerides 163 Physical Exam:    VS:  BP (!) 146/84   Pulse 68   Ht _0  (1.575 m)   Wt 174 lb 6.4 oz (79.1 kg)   SpO2 96%   BMI 31.90 kg/m     Wt Readings from Last 3 Encounters:  11/03/20 174 lb 6.4 oz (79.1 kg)  12/04/19 166 lb 12.8 oz (75.7 kg)  11/20/19 166 lb 12.8 oz (75.7 kg)     General: Alert, oriented x3, no distress, borderline obese Head: no evidence of trauma, PERRL, EOMI, no exophtalmos or lid lag, no myxedema, no xanthelasma; normal ears, nose and oropharynx Neck: normal jugular venous pulsations and no hepatojugular reflux; brisk carotid pulses without delay and no carotid bruits Chest: clear to auscultation, no signs  of consolidation by percussion or palpation, normal fremitus, symmetrical and full respiratory excursions Cardiovascular: normal position and quality of the apical impulse, regular rhythm, normal first and second heart sounds, no murmurs, rubs or gallops Abdomen: no tenderness or distention, no masses by palpation, no abnormal pulsatility or arterial bruits, normal bowel sounds, no hepatosplenomegaly Extremities: no clubbing, cyanosis or edema; 2+ radial, ulnar and brachial pulses bilaterally; 2+ right femoral, posterior tibial and dorsalis pedis pulses; 2+ left femoral, posterior tibial and dorsalis pedis pulses; no subclavian or femoral bruits Neurological: grossly nonfocal Psych: Normal mood and affect   ASSESSMENT:    1. SSS (sick sinus syndrome) ([place])   2. Pacemaker   3. Hypercholesterolemia   4. Diabetes mellitus type 2 in obese ([place])   5. Essential hypertension    PLAN:    In order of problems listed above:  1. SSS: History of syncope and severe sinus bradycardia as well as impressively long sinus pauses over 5 seconds.  He is completely asymptomatic  since the pacemaker was implanted.  Device shows good heart rate histogram distribution with the CLS. 2. Pacemaker: Continue remote downloads every 3 months and office visits yearly. 3. HLP: On the current statin prescription all parameters are normal range.  Note a remarkable improvement in HDL since last year, however the LDL is also a little higher. 4. DM: Excellent glycemic control on GLP-1 agonist.  Would still encourage weight loss. 5. HTN: Her blood pressure is well controlled usually.  Just a few days ago at her primary care office her blood pressure was 118/64.  She took her medicines late today and the blood pressure is a little high.  Discussed the potential for beta-blocker rebound with sudden interruptions and carvedilol.   Medication Adjustments/Labs and Tests Ordered: Current medicines are reviewed at length with the patient today.  Concerns regarding medicines are outlined above.  Orders Placed This Encounter  Procedures  . EKG 12-Lead   No orders of the defined types were placed in this encounter.   Patient Instructions  Medication Instructions:  No changes *If you need a refill on your cardiac medications before your next appointment, please call your pharmacy*   Lab Work: None ordered If you have labs (blood work) drawn today and your tests are completely normal, you will receive your results only by: Marland Kitchen MyChart Message (if you have MyChart) OR . A paper copy in the mail If you have any lab test that is abnormal or we need to change your treatment, we will call you to review the results.   Testing/Procedures: None ordered   Follow-Up: At [facility], you and your health needs are our priority.  As part of our continuing mission to provide you with exceptional heart care, we have created designated Provider Care Teams.  These Care Teams include your primary Cardiologist (physician) and Advanced Practice Providers (APPs -  Physician Assistants and Nurse  Practitioners) who all work together to provide you with the care you need, when you need it.  We recommend signing up for the patient portal called "MyChart".  Sign up information is provided on this After Visit Summary.  MyChart is used to connect with patients for Virtual Visits (Telemedicine).  Patients are able to view lab/test results, encounter notes, upcoming appointments, etc.  Non-urgent messages can be sent to your provider as well.   To learn more about what you can do with MyChart, go to [email or web address].    Your next appointment:   12 month(s)  The format for your next appointment:   In Person  Provider:   Sanda Klein, MD      Signed, Sanda Klein, MD  11/04/2020 8:52 PM    [place]

## 2020-11-03 NOTE — Patient Instructions (Signed)

## 2020-11-04 ENCOUNTER — Encounter: Payer: Self-pay | Admitting: Cardiovascular Disease

## 2020-11-09 ENCOUNTER — Ambulatory Visit (INDEPENDENT_AMBULATORY_CARE_PROVIDER_SITE_OTHER): Payer: 59

## 2020-11-09 DIAGNOSIS — I495 Sick sinus syndrome: Secondary | ICD-10-CM

## 2020-11-10 LAB — CUP PACEART REMOTE DEVICE CHECK
Date Time Interrogation Session: 20220511074730
Implantable Lead Implant Date: 20190502
Implantable Lead Implant Date: 20190502
Implantable Lead Location: 753859
Implantable Lead Location: 753860
Implantable Lead Model: 377
Implantable Lead Model: 377
Implantable Lead Serial Number: 80690122
Implantable Lead Serial Number: 8069098
Implantable Pulse Generator Implant Date: 20190502
Pulse Gen Model: 407145
Pulse Gen Serial Number: 8069098

## 2020-12-01 NOTE — Progress Notes (Signed)
Remote pacemaker transmission.   

## 2021-02-08 ENCOUNTER — Ambulatory Visit (INDEPENDENT_AMBULATORY_CARE_PROVIDER_SITE_OTHER): Payer: 59

## 2021-02-08 DIAGNOSIS — I495 Sick sinus syndrome: Secondary | ICD-10-CM

## 2021-02-08 LAB — CUP PACEART REMOTE DEVICE CHECK
Date Time Interrogation Session: 20220808115220
Implantable Lead Implant Date: 20190502
Implantable Lead Implant Date: 20190502
Implantable Lead Location: 753859
Implantable Lead Location: 753860
Implantable Lead Model: 377
Implantable Lead Model: 377
Implantable Lead Serial Number: 80690122
Implantable Lead Serial Number: 8069098
Implantable Pulse Generator Implant Date: 20190502
Pulse Gen Model: 407145
Pulse Gen Serial Number: 8069098

## 2021-03-03 NOTE — Progress Notes (Signed)
Remote pacemaker transmission.   

## 2021-05-10 ENCOUNTER — Ambulatory Visit (INDEPENDENT_AMBULATORY_CARE_PROVIDER_SITE_OTHER): Payer: 59

## 2021-05-10 DIAGNOSIS — I495 Sick sinus syndrome: Secondary | ICD-10-CM

## 2021-05-10 LAB — CUP PACEART REMOTE DEVICE CHECK
Date Time Interrogation Session: 20221108144316
Implantable Lead Implant Date: 20190502
Implantable Lead Implant Date: 20190502
Implantable Lead Location: 753859
Implantable Lead Location: 753860
Implantable Lead Model: 377
Implantable Lead Model: 377
Implantable Lead Serial Number: 80690122
Implantable Lead Serial Number: 8069098
Implantable Pulse Generator Implant Date: 20190502
Pulse Gen Model: 407145
Pulse Gen Serial Number: 8069098

## 2021-05-18 NOTE — Progress Notes (Signed)
Remote pacemaker transmission.   

## 2021-08-09 ENCOUNTER — Ambulatory Visit (INDEPENDENT_AMBULATORY_CARE_PROVIDER_SITE_OTHER): Payer: 59

## 2021-08-09 DIAGNOSIS — I495 Sick sinus syndrome: Secondary | ICD-10-CM

## 2021-08-09 LAB — CUP PACEART REMOTE DEVICE CHECK
Date Time Interrogation Session: 20230207084613
Implantable Lead Implant Date: 20190502
Implantable Lead Implant Date: 20190502
Implantable Lead Location: 753859
Implantable Lead Location: 753860
Implantable Lead Model: 377
Implantable Lead Model: 377
Implantable Lead Serial Number: 80690122
Implantable Lead Serial Number: 8069098
Implantable Pulse Generator Implant Date: 20190502
Pulse Gen Model: 407145
Pulse Gen Serial Number: 8069098

## 2021-08-12 NOTE — Progress Notes (Signed)
Remote pacemaker transmission.   

## 2021-08-25 ENCOUNTER — Other Ambulatory Visit: Payer: Self-pay | Admitting: Internal Medicine

## 2021-08-25 DIAGNOSIS — Z1231 Encounter for screening mammogram for malignant neoplasm of breast: Secondary | ICD-10-CM

## 2021-08-27 ENCOUNTER — Other Ambulatory Visit: Payer: Self-pay | Admitting: Cardiovascular Disease

## 2021-10-06 ENCOUNTER — Ambulatory Visit
Admission: RE | Admit: 2021-10-06 | Discharge: 2021-10-06 | Disposition: A | Discharge status: Home / Self Care | Payer: Managed Care, Other (non HMO) | Source: Ambulatory Visit | Attending: Internal Medicine | Admitting: Internal Medicine

## 2021-10-06 DIAGNOSIS — Z1231 Encounter for screening mammogram for malignant neoplasm of breast: Secondary | ICD-10-CM

## 2021-10-31 ENCOUNTER — Encounter: Payer: Self-pay | Admitting: Cardiovascular Disease

## 2021-10-31 ENCOUNTER — Ambulatory Visit (INDEPENDENT_AMBULATORY_CARE_PROVIDER_SITE_OTHER): Payer: Managed Care, Other (non HMO) | Admitting: Cardiovascular Disease

## 2021-10-31 VITALS — BP 138/88 | HR 66 | Ht 62.0 in | Wt 169.6 lb

## 2021-10-31 DIAGNOSIS — I495 Sick sinus syndrome: Secondary | ICD-10-CM | POA: Diagnosis not present

## 2021-10-31 DIAGNOSIS — Z950 Presence of cardiac pacemaker: Secondary | ICD-10-CM

## 2021-10-31 DIAGNOSIS — E669 Obesity, unspecified: Secondary | ICD-10-CM

## 2021-10-31 DIAGNOSIS — E1169 Type 2 diabetes mellitus with other specified complication: Secondary | ICD-10-CM

## 2021-10-31 DIAGNOSIS — E7800 Pure hypercholesterolemia, unspecified: Secondary | ICD-10-CM

## 2021-10-31 DIAGNOSIS — I10 Essential (primary) hypertension: Secondary | ICD-10-CM

## 2021-10-31 NOTE — Patient Instructions (Addendum)
Medication Instructions:  No changes *If you need a refill on your cardiac medications before your next appointment, please call your pharmacy*   Lab Work: None ordered If you have labs (blood work) drawn today and your tests are completely normal, you will receive your results only by: MyChart Message (if you have MyChart) OR A paper copy in the mail If you have any lab test that is abnormal or we need to change your treatment, we will call you to review the results.   Testing/Procedures: None ordered   Follow-Up: At [facility], you and your health needs are our priority.  As part of our continuing mission to provide you with exceptional heart care, we have created designated Provider Care Teams.  These Care Teams include your primary Cardiologist (physician) and Advanced Practice Providers (APPs -  Physician Assistants and Nurse Practitioners) who all work together to provide you with the care you need, when you need it.  We recommend signing up for the patient portal called "MyChart".  Sign up information is provided on this After Visit Summary.  MyChart is used to connect with patients for Virtual Visits (Telemedicine).  Patients are able to view lab/test results, encounter notes, upcoming appointments, etc.  Non-urgent messages can be sent to your provider as well.   To learn more about what you can do with MyChart, go to [email or web address].    Your next appointment:   12 month(s)  The format for your next appointment:   In Person  Provider:   Dr. Croitoru  Important Information About Sugar       

## 2021-10-31 NOTE — Progress Notes (Signed)
?Cardiology Office Note:   ? ?Date:  11/01/2021  ? ?ID:  [name], DOB 1956-05-28, MRN [id number] ? ?PCP:  [place]  ?Cardiologist:  Keniel Ralston ?Electrophysiologist:  None  ? ?No chief complaint on file. ? ? ?History of Present Illness:   ? ?Meghan Nguyen is a 66 y.o. female with a hx of severe sinus node dysfunction leading to syncope, sinus pauses of greater than 5 seconds documented on event monitor in 2019.  She received a dual-chamber Biotronik pacemaker in May 2019 and has not had problems with dizziness, syncope or fatigue since then. ? ?Additional medical problems include hypertension and hypercholesterolemia and type II diabetes mellitus.  Cardiac catheterization in 2019 did not show any evidence of coronary artery disease (false positive nuclear stress test) and LV angiography documented normal left ventricular ejection fraction.  ? ?The patient specifically denies any chest pain at rest exertion, dyspnea at rest or with exertion, orthopnea, paroxysmal nocturnal dyspnea, syncope, palpitations, focal neurological deficits, intermittent claudication, lower extremity edema, unexplained weight gain, cough, hemoptysis or wheezing. ? ?She has not had any serious medical problems since her last appointment. ? ?Pacemaker interrogation in the office today shows normal device function.  Her dual-chamber Biotronik pacemaker has over 6 years of remaining longevity.  She has 66 % atrial pacing and no ventricular pacing, not pacemaker dependent.  She had a 4-second episode of paroxysmal atrial tachycardia in December and no documented arrhythmia since.  She has a good heart rate histogram distribution.  The device documents an average of about 12% activity (roughly 3 hours a day).  Lead parameters, recheck today overall normal.  Presenting rhythm was atrial paced, ventricular sensed, underlying rhythm is mild sinus bradycardia in the mid 50s. ? ?Metabolic parameters are good with  well-controlled hemoglobin A1c, barely in the prediabetes range and good LDL cholesterol levels.  She is due for repeat labs with her PCP in the next few weeks. ? ? ?Past Medical History:  ?Diagnosis Date  ? Allergy   ? allergies  ? Anxiety   ? Cancer [facility]) 2003  ? Melanoma   ? Diabetes mellitus without complication ([place])   ? GERD (gastroesophageal reflux disease)   ? Hyperlipidemia   ? Hypertension   ? SSS (sick sinus syndrome) ([place]) 2019  ? ? ?Past Surgical History:  ?Procedure Laterality Date  ? bladder tacked    ? BREAST EXCISIONAL BIOPSY Left 1990  ? benign  ? COLONOSCOPY  2018  ? PACEMAKER IMPLANT Left 10/2017  ? Biotronik  ? ? ?Current Medications: ?Current Meds  ?Medication Sig  ? amLODipine (NORVASC) 5 MG tablet Take 5 mg by mouth daily.  ? atorvastatin (LIPITOR) 40 MG tablet Take 40 mg by mouth daily.  ? Black Elderberry (SAMBUCUS ELDERBERRY PO) Take by mouth daily.  ? Cholecalciferol (VITAMIN D3) 50 MCG (2000 UT) TABS Take 2 tablets by mouth daily.  ? citalopram (CELEXA) 20 MG tablet Take 20 mg by mouth daily.  ? fluticasone (FLONASE) 50 MCG/ACT nasal spray Place 1 spray into both nostrils daily.  ? loratadine (CLARITIN) 10 MG tablet Take 10 mg by mouth daily.  ? Melatonin 10 MG TABS Take by mouth at bedtime.  ? metoprolol tartrate (LOPRESSOR) 25 MG tablet Take 1 tablet by mouth twice daily  ? montelukast (SINGULAIR) 10 MG tablet Take 10 mg by mouth at bedtime.  ? MYRBETRIQ 25 MG TB24 tablet Take 25 mg by mouth daily.  ? Omega-3 1000 MG CAPS Take by mouth  daily.  ? omeprazole (PRILOSEC) 40 MG capsule Take 40 mg by mouth daily.  ? oxybutynin (DITROPAN-XL) 10 MG 24 hr tablet Take 10 mg by mouth daily.  ? Turmeric 500 MG CAPS Take by mouth daily.  ? Vitamin D, Ergocalciferol, (DRISDOL) 1.25 MG (50000 UNIT) CAPS capsule Take 50,000 Units by mouth once a week.  ?  ? ?Allergies:   Patient has no known allergies.  ? ?Social History  ? ?Socioeconomic History  ? Marital status: Married  ?  Spouse name: Not on file   ? Number of children: Not on file  ? Years of education: Not on file  ? Highest education level: Not on file  ?Occupational History  ? Not on file  ?Tobacco Use  ? Smoking status: Never  ? Smokeless tobacco: Never  ?Vaping Use  ? Vaping Use: Never used  ?Substance and Sexual Activity  ? Alcohol use: Not Currently  ? Drug use: Never  ? Sexual activity: Not on file  ?Other Topics Concern  ? Not on file  ?Social History Narrative  ? Not on file  ? ?Social Determinants of Health  ? ?Financial Resource Strain: Not on file  ?Food Insecurity: Not on file  ?Transportation Needs: Not on file  ?Physical Activity: Not on file  ?Stress: Not on file  ?Social Connections: Not on file  ?  ? ?Family History: ?The patient's family history significantly negative for early onset coronary or peripheral vascular disease or rhythm abnormalities. ?ROS:   ?Please see the history of present illness.    ? All other systems reviewed and are negative. ? ?EKGs/Labs/Other Studies Reviewed:   ? ?The following studies were reviewed today: ?Comprehensive pacemaker check in the office today. ? ?EKG:  EKG is  ordered today.  It shows atrial paced, ventricular sensed rhythm with long AV delay at 234 ms, otherwise normal tracing, QTC 444 ms. ? ?Recent Labs: ?No results found for requested labs within last 8760 hours.  ?04/15/2019 creatinine 0.71, potassium 4.2, normal liver function tests ?07/15/2019 ?Creatinine 0.77, potassium 4.3, normal liver function tests, TSH 1.15, hemoglobin 13.8, hemoglobin A1c 5.8%. ?09/23/2020 ?Creatinine 0.75, potassium 4.3, hemoglobin 15.2, normal liver function tests, TSH 1.1, hemoglobin A1c 5.9% ?Recent Lipid Panel ?No results found for: CHOL, TRIG, HDL, CHOLHDL, VLDL, LDLCALC, LDLDIRECT  ?04/15/2019 ?Total cholesterol 121, triglycerides are 123, HDL 36, LDL 50 ?07/15/2019 ?Total cholesterol 137, HDL 45, triglyceride 167, LDL 50 ?09/23/2020 ?Cholesterol 161, HDL 55, LDL 78, triglycerides 163 ?Physical Exam:   ? ?VS:  BP  138/88 (BP Location: Left Arm, Patient Position: Sitting, Cuff Size: Normal)   Pulse 66   Ht '5\' 2"'$  (1.575 m)   Wt 169 lb 9.6 oz (76.9 kg)   SpO2 95%   BMI 31.02 kg/m?    ? ?Wt Readings from Last 3 Encounters:  ?10/31/21 169 lb 9.6 oz (76.9 kg)  ?11/03/20 174 lb 6.4 oz (79.1 kg)  ?12/04/19 166 lb 12.8 oz (75.7 kg)  ?  ? ? ?General: Alert, oriented x3, no distress, borderline obese ?Head: no evidence of trauma, PERRL, EOMI, no exophtalmos or lid lag, no myxedema, no xanthelasma; normal ears, nose and oropharynx ?Neck: normal jugular venous pulsations and no hepatojugular reflux; brisk carotid pulses without delay and no carotid bruits ?Chest: clear to auscultation, no signs of consolidation by percussion or palpation, normal fremitus, symmetrical and full respiratory excursions ?Cardiovascular: normal position and quality of the apical impulse, regular rhythm, normal first and second heart sounds, no murmurs, rubs or gallops ?  Abdomen: no tenderness or distention, no masses by palpation, no abnormal pulsatility or arterial bruits, normal bowel sounds, no hepatosplenomegaly ?Extremities: no clubbing, cyanosis or edema; 2+ radial, ulnar and brachial pulses bilaterally; 2+ right femoral, posterior tibial and dorsalis pedis pulses; 2+ left femoral, posterior tibial and dorsalis pedis pulses; no subclavian or femoral bruits ?Neurological: grossly nonfocal ?Psych: Normal mood and affect ? ? ? ?ASSESSMENT:   ? ?1. SSS (sick sinus syndrome) ([place])   ?2. Pacemaker   ?3. Hypercholesterolemia   ?4. Diabetes mellitus type 2 in obese [facility])   ?5. Essential hypertension   ? ? ?PLAN:   ? ?In order of problems listed above: ? ?SSS: History of syncope and severe sinus bradycardia as well as impressively long sinus pauses over 5 seconds.  Asymptomatic since pacemaker implantation without recurrence of syncope.  Good heart rate histogram distribution based on current sensor settings.   ?Pacemaker: Continue remote downloads every 3  months and yearly office visits. ?HLP: On the current statin prescription all parameters are normal range.  Note a remarkable improvement in HDL since last year, however the LDL is also a little higher. ?DM: Well-controlle

## 2021-11-08 ENCOUNTER — Ambulatory Visit (INDEPENDENT_AMBULATORY_CARE_PROVIDER_SITE_OTHER): Payer: Commercial Managed Care - HMO

## 2021-11-08 DIAGNOSIS — I495 Sick sinus syndrome: Secondary | ICD-10-CM

## 2021-11-08 LAB — CUP PACEART REMOTE DEVICE CHECK
Date Time Interrogation Session: 20230509092117
Implantable Lead Implant Date: 20190502
Implantable Lead Implant Date: 20190502
Implantable Lead Location: 753859
Implantable Lead Location: 753860
Implantable Lead Model: 377
Implantable Lead Model: 377
Implantable Lead Serial Number: 80690122
Implantable Lead Serial Number: 8069098
Implantable Pulse Generator Implant Date: 20190502
Pulse Gen Model: 407145
Pulse Gen Serial Number: 8069098

## 2021-11-22 NOTE — Progress Notes (Signed)
Remote pacemaker transmission.   

## 2022-02-07 ENCOUNTER — Ambulatory Visit (INDEPENDENT_AMBULATORY_CARE_PROVIDER_SITE_OTHER): Payer: Commercial Managed Care - HMO

## 2022-02-07 DIAGNOSIS — I495 Sick sinus syndrome: Secondary | ICD-10-CM

## 2022-02-08 LAB — CUP PACEART REMOTE DEVICE CHECK
Date Time Interrogation Session: 20230809060648
Implantable Lead Implant Date: 20190502
Implantable Lead Implant Date: 20190502
Implantable Lead Location: 753859
Implantable Lead Location: 753860
Implantable Lead Model: 377
Implantable Lead Model: 377
Implantable Lead Serial Number: 80690122
Implantable Lead Serial Number: 8069098
Implantable Pulse Generator Implant Date: 20190502
Pulse Gen Model: 407145
Pulse Gen Serial Number: 8069098

## 2022-03-15 NOTE — Progress Notes (Signed)
Remote pacemaker transmission.   

## 2022-05-09 ENCOUNTER — Ambulatory Visit (INDEPENDENT_AMBULATORY_CARE_PROVIDER_SITE_OTHER): Payer: Commercial Managed Care - HMO

## 2022-05-09 DIAGNOSIS — Z950 Presence of cardiac pacemaker: Secondary | ICD-10-CM | POA: Diagnosis not present

## 2022-05-09 DIAGNOSIS — I495 Sick sinus syndrome: Secondary | ICD-10-CM

## 2022-05-09 LAB — CUP PACEART REMOTE DEVICE CHECK
Date Time Interrogation Session: 20231107080759
Implantable Lead Connection Status: 753985
Implantable Lead Connection Status: 753985
Implantable Lead Implant Date: 20190502
Implantable Lead Implant Date: 20190502
Implantable Lead Location: 753859
Implantable Lead Location: 753860
Implantable Lead Model: 377
Implantable Lead Model: 377
Implantable Lead Serial Number: 80690122
Implantable Lead Serial Number: 8069098
Implantable Pulse Generator Implant Date: 20190502
Pulse Gen Model: 407145
Pulse Gen Serial Number: 8069098

## 2022-06-05 NOTE — Progress Notes (Signed)
Remote pacemaker transmission.   

## 2022-07-24 ENCOUNTER — Other Ambulatory Visit: Payer: Self-pay | Admitting: Internal Medicine

## 2022-07-24 DIAGNOSIS — Z1231 Encounter for screening mammogram for malignant neoplasm of breast: Secondary | ICD-10-CM

## 2022-08-08 ENCOUNTER — Ambulatory Visit: Payer: Self-pay

## 2022-08-08 DIAGNOSIS — I495 Sick sinus syndrome: Secondary | ICD-10-CM

## 2022-08-08 LAB — CUP PACEART REMOTE DEVICE CHECK
Date Time Interrogation Session: 20240206074549
Implantable Lead Connection Status: 753985
Implantable Lead Connection Status: 753985
Implantable Lead Implant Date: 20190502
Implantable Lead Implant Date: 20190502
Implantable Lead Location: 753859
Implantable Lead Location: 753860
Implantable Lead Model: 377
Implantable Lead Model: 377
Implantable Lead Serial Number: 80690122
Implantable Lead Serial Number: 8069098
Implantable Pulse Generator Implant Date: 20190502
Pulse Gen Model: 407145
Pulse Gen Serial Number: 8069098

## 2022-09-06 NOTE — Progress Notes (Signed)
Remote pacemaker transmission.   

## 2022-10-09 ENCOUNTER — Ambulatory Visit
Admission: RE | Admit: 2022-10-09 | Discharge: 2022-10-09 | Disposition: A | Discharge status: Home / Self Care | Payer: 59 | Source: Ambulatory Visit | Attending: Internal Medicine | Admitting: Internal Medicine

## 2022-10-09 DIAGNOSIS — Z1231 Encounter for screening mammogram for malignant neoplasm of breast: Secondary | ICD-10-CM

## 2022-11-05 IMAGING — MG MM DIGITAL SCREENING BILAT W/ TOMO AND CAD
6 of 10 series · 6 of 30 positions shown · non-contrast
Comparison: Previous exam(s).

CLINICAL DATA: Screening.

EXAM:
DIGITAL SCREENING BILATERAL MAMMOGRAM WITH TOMOSYNTHESIS AND CAD
TECHNIQUE: Bilateral screening digital craniocaudal and mediolateral oblique
mammograms were obtained. Bilateral screening digital breast
tomosynthesis was performed. The images were evaluated with
computer-aided detection.

[L CC synth-2D]
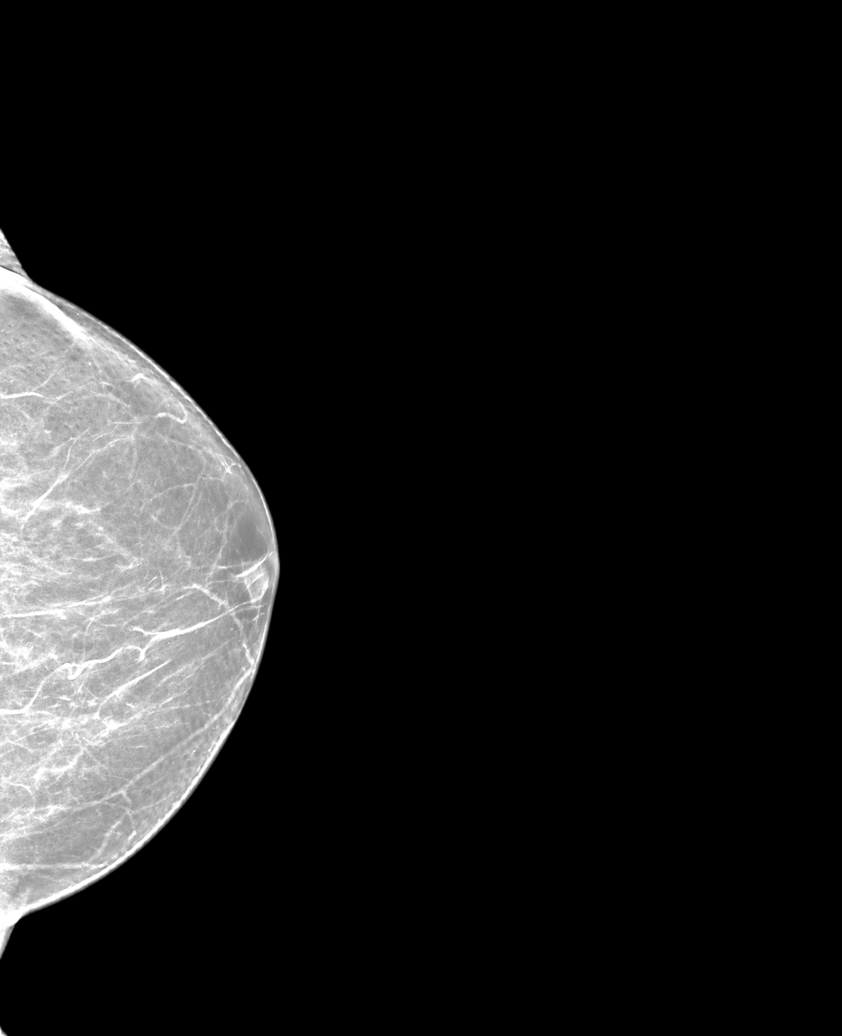

[R MLO synth-2D]
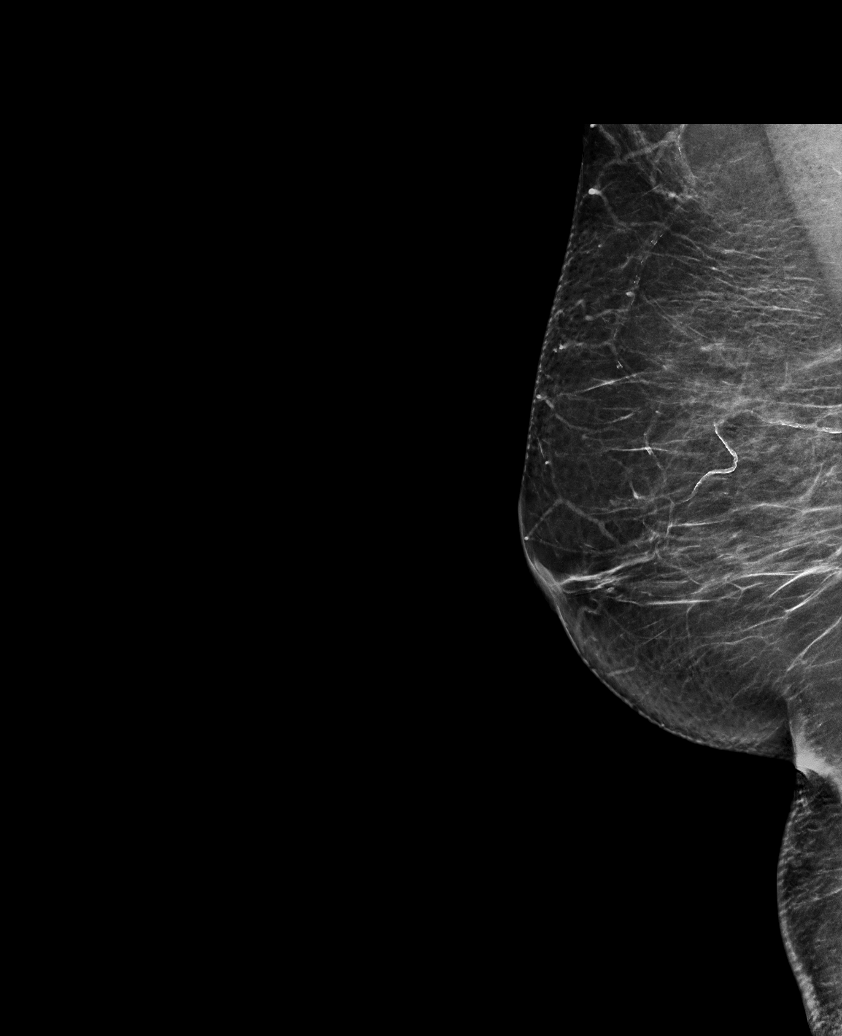

[L MLO synth-2D (1 of 2)]
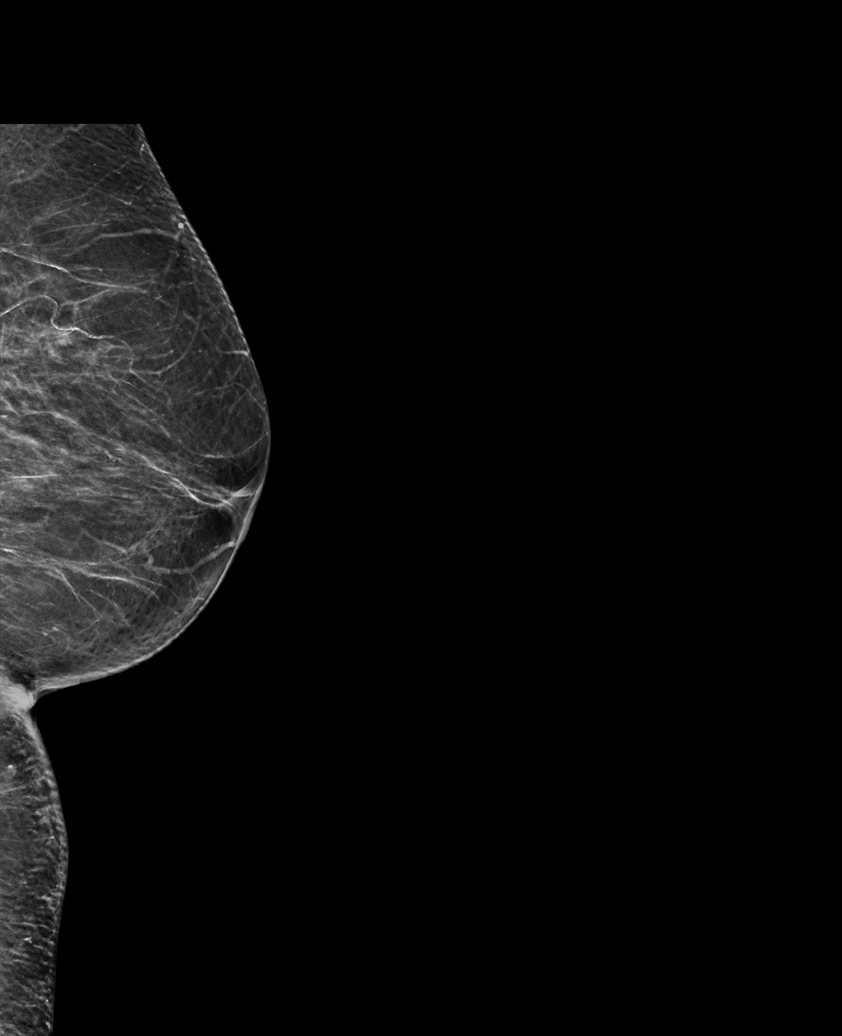

[L MLO synth-2D (2 of 2)]
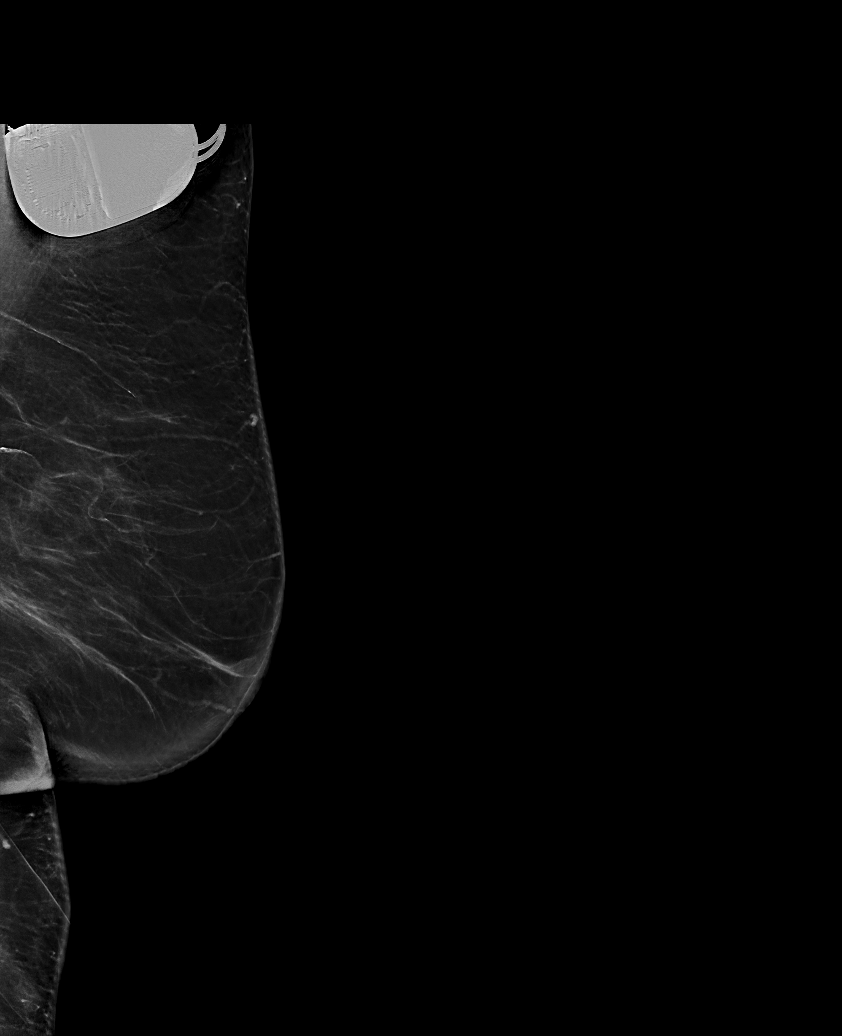

[R CC synth-2D]
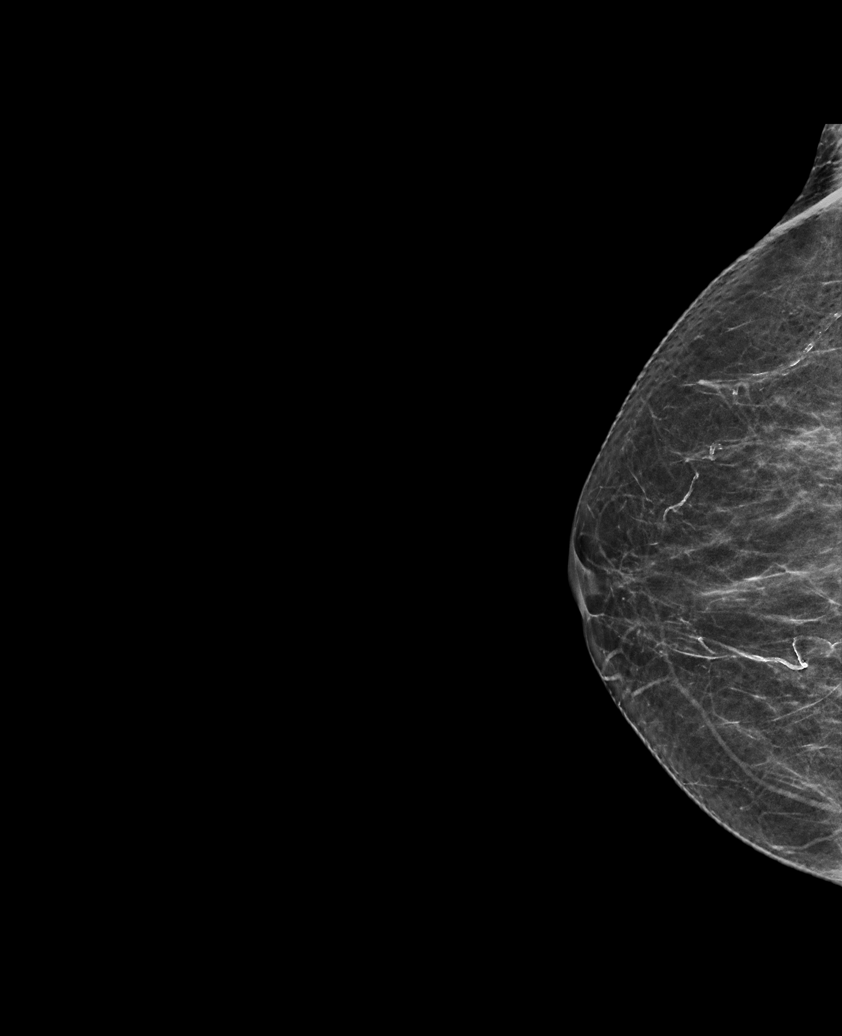

[L MLO tomo · tomo slice 30/59.0]
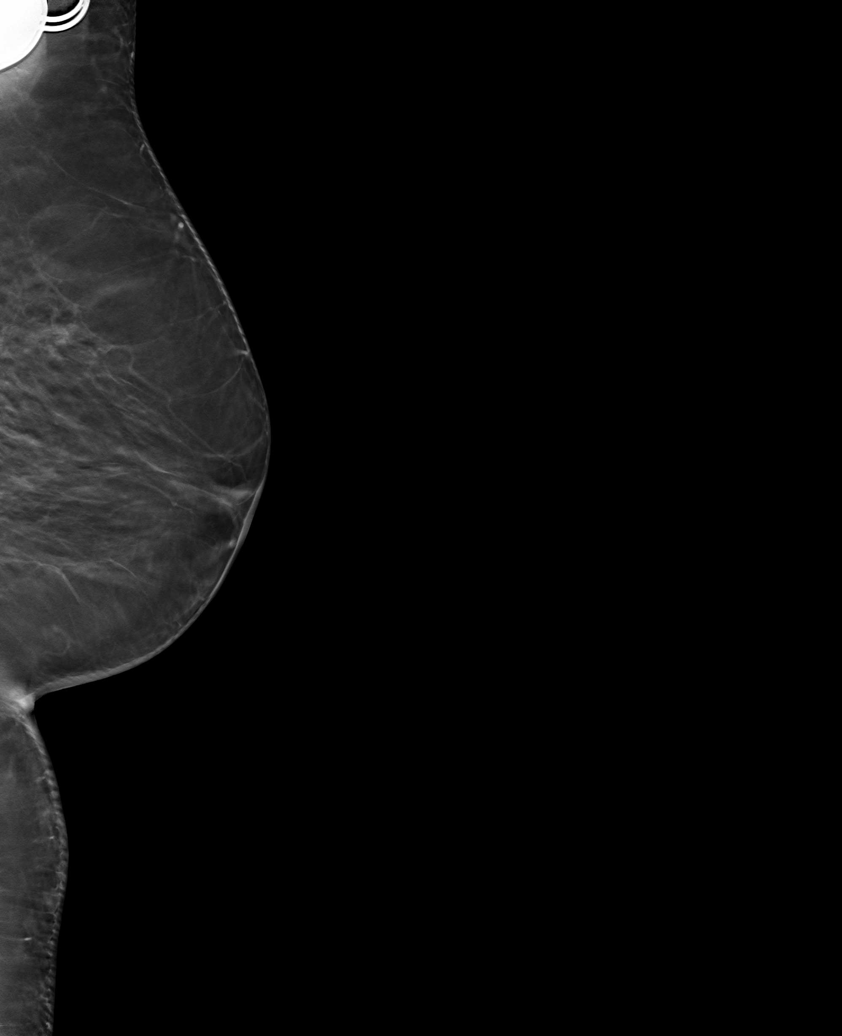

[6 of 30 positions shown; findings below may reference images not displayed]

ACR Breast Density Category b: There are scattered areas of
fibroglandular density.
FINDINGS: There are no findings suspicious for malignancy. A cardiac device
overlies the left chest wall.
IMPRESSION: No mammographic evidence of malignancy. A result letter of this
screening mammogram will be mailed directly to the patient.

RECOMMENDATION:
Screening mammogram in one year. (Code:LZ-5-63E)

BI-RADS CATEGORY  1: Negative.

## 2022-11-06 ENCOUNTER — Ambulatory Visit (INDEPENDENT_AMBULATORY_CARE_PROVIDER_SITE_OTHER): Payer: 59

## 2022-11-06 DIAGNOSIS — I495 Sick sinus syndrome: Secondary | ICD-10-CM | POA: Diagnosis not present

## 2022-11-06 LAB — CUP PACEART REMOTE DEVICE CHECK
Battery Remaining Percentage: 60 %
Brady Statistic RA Percent Paced: 88 %
Brady Statistic RV Percent Paced: 0 %
Date Time Interrogation Session: 20240506110019
Implantable Lead Connection Status: 753985
Implantable Lead Connection Status: 753985
Implantable Lead Implant Date: 20190502
Implantable Lead Implant Date: 20190502
Implantable Lead Location: 753859
Implantable Lead Location: 753860
Implantable Lead Model: 377
Implantable Lead Model: 377
Implantable Lead Serial Number: 80690122
Implantable Lead Serial Number: 8069098
Implantable Pulse Generator Implant Date: 20190502
Lead Channel Impedance Value: 449 Ohm
Lead Channel Impedance Value: 566 Ohm
Lead Channel Pacing Threshold Amplitude: 0.8 V
Lead Channel Pacing Threshold Amplitude: 0.9 V
Lead Channel Pacing Threshold Pulse Width: 0.4 ms
Lead Channel Pacing Threshold Pulse Width: 0.4 ms
Lead Channel Sensing Intrinsic Amplitude: 10.2 mV
Lead Channel Sensing Intrinsic Amplitude: 2.1 mV
Lead Channel Setting Pacing Pulse Width: 0.4 ms
Pulse Gen Model: 407145
Pulse Gen Serial Number: 8069098

## 2022-12-05 NOTE — Progress Notes (Signed)
Remote pacemaker transmission.   

## 2022-12-18 ENCOUNTER — Telehealth: Payer: Self-pay

## 2022-12-18 NOTE — Telephone Encounter (Signed)
Pt called stating she transferred care because to [facility] due to her insurance not in network with [place].

## 2023-01-05 ENCOUNTER — Encounter: Payer: Self-pay | Admitting: Gastroenterology

## 2023-01-11 ENCOUNTER — Encounter: Payer: Self-pay | Admitting: Gastroenterology

## 2023-02-06 ENCOUNTER — Ambulatory Visit (AMBULATORY_SURGERY_CENTER): Payer: 59

## 2023-02-06 VITALS — Ht 62.0 in | Wt 167.0 lb

## 2023-02-06 DIAGNOSIS — Z86010 Personal history of colonic polyps: Secondary | ICD-10-CM

## 2023-02-06 NOTE — Progress Notes (Signed)

## 2023-02-23 ENCOUNTER — Encounter: Payer: Self-pay | Admitting: Gastroenterology

## 2023-03-05 ENCOUNTER — Encounter: Payer: Self-pay | Admitting: Certified Registered Nurse Anesthetist

## 2023-03-09 ENCOUNTER — Encounter: Payer: Self-pay | Admitting: Gastroenterology

## 2023-03-09 ENCOUNTER — Ambulatory Visit (AMBULATORY_SURGERY_CENTER): Payer: 59 | Admitting: Gastroenterology

## 2023-03-09 VITALS — BP 131/63 | HR 68 | Temp 98.0°F | Resp 11 | Ht 62.0 in | Wt 167.0 lb

## 2023-03-09 DIAGNOSIS — D122 Benign neoplasm of ascending colon: Secondary | ICD-10-CM | POA: Diagnosis not present

## 2023-03-09 DIAGNOSIS — D125 Benign neoplasm of sigmoid colon: Secondary | ICD-10-CM

## 2023-03-09 DIAGNOSIS — Z09 Encounter for follow-up examination after completed treatment for conditions other than malignant neoplasm: Secondary | ICD-10-CM

## 2023-03-09 DIAGNOSIS — Z86010 Personal history of colonic polyps: Secondary | ICD-10-CM

## 2023-03-09 MED ORDER — SODIUM CHLORIDE 0.9 % IV SOLN: 500.0000 mL | INTRAVENOUS | Status: DC

## 2023-03-09 NOTE — Op Note (Signed)
[facility] Patient Name: Meghan Nguyen Procedure Date: 03/09/2023 8:57 AM MRN: [id number] Endoscopist: Lynann Bologna , MD, [id number] Age: 67 Referring MD:  Date of Birth: 1956/05/23 Gender: Female Account #: [id number] Procedure:                Colonoscopy Indications:              High risk colon cancer surveillance: Personal                            history of colonic polyps 2021. FH polyps- mom <60 Medicines:                Monitored Anesthesia Care Procedure:                Pre-Anesthesia Assessment:                           - Prior to the procedure, a History and Physical                            was performed, and patient medications and                            allergies were reviewed. The patient's tolerance of                            previous anesthesia was also reviewed. The risks                            and benefits of the procedure and the sedation                            options and risks were discussed with the patient.                            All questions were answered, and informed consent                            was obtained. Prior Anticoagulants: The patient has                            taken no anticoagulant or antiplatelet agents. ASA                            Grade Assessment: II - A patient with mild systemic                            disease. After reviewing the risks and benefits,                            the patient was deemed in satisfactory condition to                            undergo the procedure.  After obtaining informed consent, the colonoscope                            was passed under direct vision. Throughout the                            procedure, the patient's blood pressure, pulse, and                            oxygen saturations were monitored continuously. The                            Olympus Scope [id number] was introduced through the                            anus and  advanced to the 2 cm into the ileum. The                            colonoscopy was performed without difficulty. The                            patient tolerated the procedure well. The quality                            of the bowel preparation was good. The terminal                            ileum, ileocecal valve, appendiceal orifice, and                            rectum were photographed. Scope In: 9:09:50 AM Scope Out: 9:24:30 AM Scope Withdrawal Time: 0 hours 9 minutes 5 seconds  Total Procedure Duration: 0 hours 14 minutes 40 seconds  Findings:                 Two sessile polyps were found in the proximal                            sigmoid colon and distal ascending colon. The                            polyps were 4 to 6 mm in size. These polyps were                            removed with a cold snare. Resection and retrieval                            were complete.                           A few medium-mouthed diverticula were found in the                            sigmoid colon and rare in  ascending colon.                           Non-bleeding external and internal hemorrhoids were                            found during retroflexion and during perianal exam.                            The hemorrhoids were small and Grade I (internal                            hemorrhoids that do not prolapse).                           The terminal ileum appeared normal.                           The exam was otherwise without abnormality on                            direct and retroflexion views. Complications:            No immediate complications. Estimated Blood Loss:     Estimated blood loss: none. Impression:               - Two 4 to 6 mm polyps in the proximal sigmoid                            colon and in the distal ascending colon, removed                            with a cold snare. Resected and retrieved.                           - Mild predominantly sigmoid  diverticulosis.                           - Non-bleeding external and internal hemorrhoids.                           - The examined portion of the ileum was normal.                           - The examination was otherwise normal on direct                            and retroflexion views. Recommendation:           - Patient has a contact number available for                            emergencies. The signs and symptoms of potential                            delayed complications were discussed with the  patient. Return to normal activities tomorrow.                            Written discharge instructions were provided to the                            patient.                           - High fiber diet.                           - Continue present medications.                           - Await pathology results.                           - Repeat colonoscopy for surveillance based on                            pathology results.                           - The findings and recommendations were discussed                            with the patient's family. Lynann Bologna, MD 03/09/2023 9:29:20 AM This report has been signed electronically.

## 2023-03-09 NOTE — Progress Notes (Signed)
Report given to PACU, vss 

## 2023-03-09 NOTE — Progress Notes (Signed)
Pt's states no medical or surgical changes since previsit or office visit. 

## 2023-03-09 NOTE — Progress Notes (Signed)
[facility] History and Physical   Primary Care Physician:  Pllc, [facility]   Reason for Procedure:   H/O polyps 2021/FH polyps  Plan:    colon     HPI: Meghan Nguyen is a 67 y.o. 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 Authorizing Provider  amLODipine (NORVASC) 5 MG tablet Take 5 mg by mouth daily. 05/12/19  Yes [provider]  aspirin 81 MG chewable tablet Chew by mouth daily.   Yes [provider]  atorvastatin (LIPITOR) 40 MG tablet Take 40 mg by mouth daily. 08/14/17  Yes [provider]  Cholecalciferol (VITAMIN D3) 50 MCG (2000 UT) TABS Take 2 tablets by mouth daily.   Yes [provider]  citalopram (CELEXA) 20 MG tablet Take 20 mg by mouth daily. 08/01/19  Yes [provider]  clotrimazole (LOTRIMIN) 1 % cream  07/15/19  Yes [provider]  Fluorouracil (TOLAK) 4 % CREA Apply topically.   Yes [provider]  metoprolol tartrate (LOPRESSOR) 25 MG tablet Take 1 tablet by mouth twice daily 08/29/21  Yes Croitoru, Mihai, MD  montelukast (SINGULAIR) 10 MG tablet Take 10 mg by mouth at bedtime. 10/14/19  Yes [provider]  Omega-3 1000 MG CAPS Take by mouth daily.   Yes [provider]  omeprazole (PRILOSEC) 40 MG capsule Take 40 mg by mouth daily. 08/01/19  Yes [provider]  Turmeric 500 MG CAPS Take by mouth daily.   Yes [provider]   Vitamin D, Ergocalciferol, (DRISDOL) 1.25 MG (50000 UNIT) CAPS capsule Take 50,000 Units by mouth once a week. 10/13/21  Yes [provider]  Black Elderberry (SAMBUCUS ELDERBERRY PO) Take by mouth daily. Patient not taking: Reported on 02/06/2023    [provider]  fluticasone (FLONASE) 50 MCG/ACT nasal spray Place 1 spray into both nostrils daily. Patient not taking: Reported on 02/06/2023 09/30/21   [provider]  loratadine (CLARITIN) 10 MG tablet Take 10 mg by mouth daily. Patient not taking: Reported on 03/09/2023    [provider]  meclizine (ANTIVERT) 12.5 MG tablet Take 12.5 mg by mouth 2 (two) times daily as needed. Patient not taking: Reported on 03/09/2023 08/22/19   [provider]  Melatonin 10 MG TABS Take by mouth at bedtime.    [provider]  MYRBETRIQ 25 MG TB24 tablet Take 25 mg by mouth daily. Patient not taking: Reported on 02/06/2023 07/28/19   [provider]  nitroGLYCERIN (NITROSTAT) 0.4 MG SL tablet Place under the tongue. 10/15/17   [provider]  oxybutynin (DITROPAN-XL) 10 MG 24 hr tablet Take 10 mg by mouth daily. 09/30/21   [provider]    Current Outpatient Medications  Medication Sig Dispense Refill   amLODipine (NORVASC) 5 MG tablet Take 5 mg by mouth daily.  aspirin 81 MG chewable tablet Chew by mouth daily.     atorvastatin (LIPITOR) 40 MG tablet Take 40 mg by mouth daily.     Cholecalciferol (VITAMIN D3) 50 MCG (2000 UT) TABS Take 2 tablets by mouth daily.     citalopram (CELEXA) 20 MG tablet Take 20 mg by mouth daily.     clotrimazole (LOTRIMIN) 1 % cream      Fluorouracil (TOLAK) 4 % CREA Apply topically.     metoprolol tartrate (LOPRESSOR) 25 MG tablet Take 1 tablet by mouth twice daily 180 tablet 0   montelukast (SINGULAIR) 10 MG tablet Take 10 mg by mouth at bedtime.     Omega-3 1000 MG CAPS Take by mouth daily.     omeprazole (PRILOSEC) 40 MG capsule Take 40 mg by  mouth daily.     Turmeric 500 MG CAPS Take by mouth daily.     Vitamin D, Ergocalciferol, (DRISDOL) 1.25 MG (50000 UNIT) CAPS capsule Take 50,000 Units by mouth once a week.     Black Elderberry (SAMBUCUS ELDERBERRY PO) Take by mouth daily. (Patient not taking: Reported on 02/06/2023)     fluticasone (FLONASE) 50 MCG/ACT nasal spray Place 1 spray into both nostrils daily. (Patient not taking: Reported on 02/06/2023)     loratadine (CLARITIN) 10 MG tablet Take 10 mg by mouth daily. (Patient not taking: Reported on 03/09/2023)     meclizine (ANTIVERT) 12.5 MG tablet Take 12.5 mg by mouth 2 (two) times daily as needed. (Patient not taking: Reported on 03/09/2023)     Melatonin 10 MG TABS Take by mouth at bedtime.     MYRBETRIQ 25 MG TB24 tablet Take 25 mg by mouth daily. (Patient not taking: Reported on 02/06/2023)     nitroGLYCERIN (NITROSTAT) 0.4 MG SL tablet Place under the tongue.     oxybutynin (DITROPAN-XL) 10 MG 24 hr tablet Take 10 mg by mouth daily.     Current Facility-Administered Medications  Medication Dose Route Frequency Provider Last Rate Last Admin   0.9 %  sodium chloride infusion  500 mL Intravenous Continuous Lynann Bologna, MD        Allergies as of 03/09/2023   (No Known Allergies)    Family History  Problem Relation Age of Onset   Colon cancer Mother 16   Breast cancer Paternal Aunt    Colon polyps Neg Hx    Esophageal cancer Neg Hx    Rectal cancer Neg Hx    Stomach cancer Neg Hx     Social History   Socioeconomic History   Marital status: Married    Spouse name: Not on file   Number of children: Not on file   Years of education: Not on file   Highest education level: Not on file  Occupational History   Not on file  Tobacco Use   Smoking status: Never   Smokeless tobacco: Never  Vaping Use   Vaping status: Never Used  Substance and Sexual Activity   Alcohol use: Not Currently   Drug use: Never   Sexual activity: Not on file  Other Topics Concern   Not on  file  Social History Narrative   Not on file   Social Determinants of Health   Financial Resource Strain: Not on file  Food Insecurity: Not on file  Transportation Needs: Not on file  Physical Activity: Not on file  Stress: Not on file  Social Connections: Not on file  Intimate Partner Violence: Not on file  Review of Systems: Positive for none All other review of systems negative except as mentioned in the HPI.  Physical Exam: Vital signs in last 24 hours: @VSRANGES @   General:   Alert,  Well-developed, well-nourished, pleasant and cooperative in NAD Lungs:  Clear throughout to auscultation.   Heart:  Regular rate and rhythm; no murmurs, clicks, rubs,  or gallops. Abdomen:  Soft, nontender and nondistended. Normal bowel sounds.   Neuro/Psych:  Alert and cooperative. Normal mood and affect. A and O x 3    No significant changes were identified.  The patient continues to be an appropriate candidate for the planned procedure and anesthesia.   Edman Circle, MD. [facility] 03/09/2023 8:57 AM@

## 2023-03-09 NOTE — Patient Instructions (Signed)

## 2023-03-09 NOTE — Progress Notes (Signed)
Called to room to assist during endoscopic procedure.  Patient ID and intended procedure confirmed with present staff. Received instructions for my participation in the procedure from the performing physician.  

## 2023-03-12 ENCOUNTER — Telehealth: Payer: Self-pay

## 2023-03-12 NOTE — Telephone Encounter (Signed)
  Follow up Call-     03/09/2023    7:57 AM  Call back number  Post procedure Call Back phone  # [phone]  Permission to leave phone message Yes     Patient questions:  Do you have a fever, pain , or abdominal swelling? No. Pain Score  0 *  Have you tolerated food without any problems? Yes.    Have you been able to return to your normal activities? Yes.    Do you have any questions about your discharge instructions: Diet   No. Medications  No. Follow up visit  No.  Do you have questions or concerns about your Care? No.  Actions: * If pain score is 4 or above: No action needed, pain <4.

## 2023-03-14 ENCOUNTER — Encounter: Payer: Self-pay | Admitting: Gastroenterology

## 2023-08-30 ENCOUNTER — Other Ambulatory Visit: Payer: Self-pay | Admitting: Internal Medicine

## 2023-08-30 DIAGNOSIS — Z1231 Encounter for screening mammogram for malignant neoplasm of breast: Secondary | ICD-10-CM

## 2023-10-10 ENCOUNTER — Ambulatory Visit
Admission: RE | Admit: 2023-10-10 | Discharge: 2023-10-10 | Disposition: A | Discharge status: Home / Self Care | Payer: 59 | Source: Ambulatory Visit | Attending: Internal Medicine | Admitting: Internal Medicine

## 2023-10-10 DIAGNOSIS — Z1231 Encounter for screening mammogram for malignant neoplasm of breast: Secondary | ICD-10-CM
